# Patient Record
Sex: Female | Born: 1980 | Hispanic: Yes | Marital: Single | Smoking: Never smoker
Health system: Southern US, Community
[De-identification: ages and names within clinical notes are randomized; demographics above are authoritative.]

## PROBLEM LIST (undated history)

## (undated) DIAGNOSIS — Z789 Other specified health status: Secondary | ICD-10-CM

---

## 2015-02-16 NOTE — L&D Delivery Note (Addendum)
Delivery Note Patient's last menstrual period was 04/20/2015 (exact date). Estimated Date of Delivery: 01/25/16  39.4  At 12:33 PM a viable female "Caryn BeeKevin" was delivered via Vaginal Delivery after Cesarean (Presentation: cephalic; LOA).  APGAR: 8, 9; weight 8 lb 5.3 oz (3780 g).   Placenta status: spontaneous, intact.  Cord: 3vessels with the following complications: none apparent.  Cord pH: not collected  Anesthesia:  epidural Episiotomy: None Lacerations: None Suture Repair: none Est. Blood Loss (mL):  300cc  Mom to postpartum.  Baby to Couplet care / Skin to Skin.  [facility]  She was ruptured for clear fluid and progressed to complete.  Short 2nd stage.  Baby delivered without difficulty and placed on mom's chest.  Due to short cord, it was clamped x2 and cut by me (FOB and mom declined).  Placenta delivered spontaneously and intact.  No lacerations.  IV pitocin bolus started.   Chelsea C Ward 01/22/2016, 1:51 PM

## 2015-06-06 ENCOUNTER — Encounter: Payer: Self-pay | Admitting: Emergency Medicine

## 2015-06-06 ENCOUNTER — Emergency Department
Admission: EM | Admit: 2015-06-06 | Discharge: 2015-06-06 | Disposition: A | Discharge status: Home / Self Care | Payer: Self-pay | Attending: Emergency Medicine | Admitting: Emergency Medicine

## 2015-06-06 ENCOUNTER — Emergency Department: Payer: Self-pay

## 2015-06-06 DIAGNOSIS — O209 Hemorrhage in early pregnancy, unspecified: Secondary | ICD-10-CM

## 2015-06-06 DIAGNOSIS — O208 Other hemorrhage in early pregnancy: Secondary | ICD-10-CM | POA: Insufficient documentation

## 2015-06-06 DIAGNOSIS — O43891 Other placental disorders, first trimester: Secondary | ICD-10-CM

## 2015-06-06 DIAGNOSIS — Z3A01 Less than 8 weeks gestation of pregnancy: Secondary | ICD-10-CM | POA: Insufficient documentation

## 2015-06-06 LAB — URINALYSIS COMPLETE WITH MICROSCOPIC (ARMC ONLY)
BILIRUBIN URINE: NEGATIVE
GLUCOSE, UA: NEGATIVE mg/dL
Leukocytes, UA: NEGATIVE
Nitrite: NEGATIVE
Protein, ur: 100 mg/dL — AB
SPECIFIC GRAVITY, URINE: 1.024 (ref 1.005–1.030)
pH: 7 (ref 5.0–8.0)

## 2015-06-06 LAB — COMPREHENSIVE METABOLIC PANEL
ALK PHOS: 62 U/L (ref 38–126)
ALT: 38 U/L (ref 14–54)
ANION GAP: 7 (ref 5–15)
AST: 24 U/L (ref 15–41)
Albumin: 4.1 g/dL (ref 3.5–5.0)
BUN: 14 mg/dL (ref 6–20)
CALCIUM: 8.9 mg/dL (ref 8.9–10.3)
CO2: 24 mmol/L (ref 22–32)
Chloride: 105 mmol/L (ref 101–111)
Creatinine, Ser: 0.86 mg/dL (ref 0.44–1.00)
GFR calc non Af Amer: >60 mL/min (ref >60)
Glucose, Bld: 104 mg/dL — ABNORMAL HIGH (ref 65–99)
Potassium: 3.4 mmol/L — ABNORMAL LOW (ref 3.5–5.1)
SODIUM: 136 mmol/L (ref 135–145)
TOTAL PROTEIN: 7.6 g/dL (ref 6.5–8.1)
Total Bilirubin: 0.4 mg/dL (ref 0.3–1.2)

## 2015-06-06 LAB — CBC
HCT: 38.5 % (ref 35.0–47.0)
HEMOGLOBIN: 13.5 g/dL (ref 12.0–16.0)
MCH: 31.1 pg (ref 26.0–34.0)
MCHC: 35.2 g/dL (ref 32.0–36.0)
MCV: 88.5 fL (ref 80.0–100.0)
Platelets: 279 10*3/uL (ref 150–440)
RBC: 4.35 MIL/uL (ref 3.80–5.20)
RDW: 13.5 % (ref 11.5–14.5)
WBC: 11.2 10*3/uL — ABNORMAL HIGH (ref 3.6–11.0)

## 2015-06-06 LAB — HCG, QUANTITATIVE, PREGNANCY: HCG, BETA CHAIN, QUANT, S: 55891 m[IU]/mL — AB (ref <5)

## 2015-06-06 LAB — ABO/RH: ABO/RH(D): A POS

## 2015-06-06 NOTE — Discharge Instructions (Signed)
Hemorragia vaginal durante el embarazo (primer trimestre) (Vaginal Bleeding During Pregnancy, First Trimester) Durante los primeros meses del embarazo es relativamente frecuente que se presente una pequea hemorragia (manchas). Esta situacin generalmente mejora por s misma. Estas hemorragias o manchas tienen diversas causas al inicio del embarazo. Algunas manchas pueden estar relacionadas al Big Lotsembarazo y otras no. En la International Business Machinesmayora de los casos, la hemorragia es normal y no es un problema. Sin embargo, la hemorragia tambin puede ser un signo de algo grave. Debe informar a su mdico de inmediato si tiene alguna hemorragia vaginal. Algunas causas posibles de hemorragia vaginal durante el primer trimestre incluyen:  Infeccin o inflamacin del cuello del tero.  Crecimientos anormales (plipos) en el cuello del tero.  Aborto espontneo o amenaza de aborto espontneo.  Tejido del Psychiatristembarazo se ha desarrollado fuera del tero y en una trompa de falopio (embarazo ectpico).  Se han desarrollado pequeos quistes en el tero en lugar de tejido de embarazo (embarazo molar). INSTRUCCIONES PARA EL CUIDADO EN EL HOGAR  Controle su afeccin para ver si hay cambios. Las siguientes indicaciones ayudarn a Psychologist, educationalaliviar cualquier Longs Drug Storesmolestia que pueda sentir:  Siga las indicaciones del mdico para restringir su actividad. Si el mdico le indica descanso en la cama, debe quedarse en la cama y levantarse solo para ir al bao. No obstante, el mdico puede permitirle que continu con tareas livianas.  Si es necesario, organcese para que alguien le ayude con las actividades y responsabilidades cotidianas mientras est en cama.  Lleve un registro de la cantidad y la saturacin de las toallas higinicas que Landscape architectutiliza cada da. Anote este dato.  No use tampones.No se haga duchas vaginales.  No tenga relaciones sexuales u orgasmos hasta que el mdico la autorice.  Si elimina tejido por la vagina, gurdelo para mostrrselo al  American Expressmdico.  Lancasterome solo medicamentos de venta libre o recetados, segn las indicaciones del mdico.  No tome aspirina, ya que puede causar hemorragias.  Cumpla con todas las visitas de control, segn le indique su mdico. SOLICITE ATENCIN MDICA SI:  Tiene un sangrado vaginal en cualquier momento del embarazo.  Tiene calambres o dolores de Poundparto.  Tiene fiebre que los medicamentos no Sports coachpueden controlar. SOLICITE ATENCIN MDICA DE INMEDIATO SI:   Siente calambres intensos en la espalda o en el vientre (abdomen).  Elimina cogulos grandes o tejido por la vagina.  La hemorragia aumenta.  Si se siente mareada, dbil o se desmaya.  Tiene escalofros.  Tiene una prdida importante o sale lquido a borbotones por la vagina.  Se desmaya mientras defeca. ASEGRESE DE QUE:  Comprende estas instrucciones.  Controlar su afeccin.  Recibir ayuda de inmediato si no mejora o si empeora.   Esta informacin no tiene Theme park managercomo fin reemplazar el consejo del mdico. Asegrese de hacerle al mdico cualquier pregunta que tenga.   Document Released: 11/11/2004 Document Revised: 02/06/2013 Elsevier Interactive Patient Education 2016 Elsevier Inc.  Hematoma subcorinico (Subchorionic Hematoma) Un hematoma subcorinico es una acumulacin de sangre entre la pared externa de la placenta y la pared interna del la matriz (tero). La placenta es el rgano que conecta el feto a la pared del tero. La placenta realiza la funcin de alimentacin, respiracin (oxgeno al feto) y el trabajo de eliminacin de desechos (excrecin) del feto.  Un hematoma subcorinico es la anormalidad ms frecuente encontrada en una ecografa durante el primer trimestre o principios del segundo trimestre del embarazo. Si ha habido poca o ninguna hemorragia vaginal, generalmente los pequeos hematomas se reducen por  propia cuenta y no afectan al bebé ni al embarazo. La sangre es absorbida gradualmente durante una o dos semanas.  Cuando la hemorragia comienza más tarde en el embarazo o el hematoma es más grande o se produce en una paciente de edad avanzada, el resultado puede no ser tan bueno. Los grandes hematomas pueden agrandarse aún más y aumenta las posibilidades de aborto espontáneo. El hematoma subcoriónico también aumenta el riesgo de desprendimiento precoz de la placenta del útero, muerte fetal y parto prematuro. °INSTRUCCIONES PARA EL CUIDADO EN EL HOGAR  °· Repose en cama si el médico se lo recomienda. Aunque el reposo en cama no evitará la hemorragia o un aborto espontáneo, su médico puede recomendarlo. °· Evite levantar objetos pesados (más de 10 libras [4,5 kg]), hacer ejercicio, tener relaciones sexuales o realizar duchas vaginales según se lo indique el profesional. °· Lleve un registro de la cantidad y grado de remojo (saturación) de las toallas higiénicas que utiliza cada día. Anote esta información. °· No use tampones. °· Cumpla con todas las visitas de control, según le indique su médico. El profesional podrá pedirle que se realice análisis de seguimiento, pruebas de ultrasonido o ambas. °SOLICITE ATENCIÓN MÉDICA DE INMEDIATO SI:  °· Siente calambres intensos en el estómago, en la espalda, en el abdomen o en la pelvis. °· Tiene fiebre. °· Elimina coágulos o tejidos grandes. Guarde los tejidos para que su médico los vea. °· Si la hemorragia aumenta o siente mareos, debilidad o tiene episodios de desmayos. °  °Esta información no tiene como fin reemplazar el consejo del médico. Asegúrese de hacerle al médico cualquier pregunta que tenga. °  °Document Released: 05/20/2008 Document Revised: 11/22/2012 °Elsevier Interactive Patient Education ©2016 Elsevier Inc. ° ° ° °

## 2015-06-06 NOTE — ED Notes (Signed)
MD and RN to bedside to speak with patient. Tele-interpreter used Jed Limerick(Armando # [phone]). Updated on findings and plan of care. Questions fielded by MD via interpreter.

## 2015-06-06 NOTE — ED Provider Notes (Signed)
[facility] Emergency Department Provider Note  ____________________________________________  Time seen: 2:00 AM  I have reviewed the triage vital signs and the nursing notes.   HISTORY  Chief Complaint Vaginal Bleeding     HPI Melissa French is a 35 y.o. female G5 para 4 presents with pelvic pain vaginal bleeding with a positive home pregnancy tests performed yesterday. Patient states that her last menstrual period was 04/20/2015  Past medical history Four successful pregnancies There are no active problems to display for this patient.   Past Surgical History  Procedure Laterality Date  . Cesarean section      No current outpatient prescriptions on file.  Allergies Review of patient's allergies indicates no known allergies.  No family history on file.  Social History Social History  Substance Use Topics  . Smoking status: Never Smoker   . Smokeless tobacco: None  . Alcohol Use: No    Review of Systems  Constitutional: Negative for fever. Eyes: Negative for visual changes. ENT: Negative for sore throat. Cardiovascular: Negative for chest pain. Respiratory: Negative for shortness of breath. Gastrointestinal: Positive for pelvic pain Genitourinary: Negative for dysuria. Positive for vaginal bleeding Musculoskeletal: Negative for back pain. Skin: Negative for rash. Neurological: Negative for headaches, focal weakness or numbness.   10-point ROS otherwise negative.  ____________________________________________   PHYSICAL EXAM:  VITAL SIGNS: ED Triage Vitals  Enc Vitals Group     BP 06/06/15 0058 119/70 mmHg     Pulse Rate 06/06/15 0058 90     Resp 06/06/15 0058 20     Temp 06/06/15 0058 98.1 F (36.7 C)     Temp Source 06/06/15 0058 Oral     SpO2 06/06/15 0058 100 %     Weight 06/06/15 0058 194 lb (87.998 kg)     Height 06/06/15 0058 5' (1.524 m)     Head Cir --      Peak Flow --      Pain Score 06/06/15 0057  3     Pain Loc --      Pain Edu? --      Excl. in GC? --      Constitutional: Alert and oriented. Well appearing and in no distress. Eyes: Conjunctivae are normal. PERRL. Normal extraocular movements. ENT   Head: Normocephalic and atraumatic.   Nose: No congestion/rhinnorhea.   Mouth/Throat: Mucous membranes are moist.   Neck: No stridor. Hematological/Lymphatic/Immunilogical: No cervical lymphadenopathy. Cardiovascular: Normal rate, regular rhythm. Normal and symmetric distal pulses are present in all extremities. No murmurs, rubs, or gallops. Respiratory: Normal respiratory effort without tachypnea nor retractions. Breath sounds are clear and equal bilaterally. No wheezes/rales/rhonchi. Gastrointestinal: Soft and nontender. No distention. There is no CVA tenderness. Genitourinary: deferred Musculoskeletal: Nontender with normal range of motion in all extremities. No joint effusions.  No lower extremity tenderness nor edema. Neurologic:  Normal speech and language. No gross focal neurologic deficits are appreciated. Speech is normal.  Skin:  Skin is warm, dry and intact. No rash noted. Psychiatric: Mood and affect are normal. Speech and behavior are normal. Patient exhibits appropriate insight and judgment.  ____________________________________________    LABS (pertinent positives/negatives)  Labs Reviewed  CBC - Abnormal; Notable for the following:    WBC 11.2 (*)    All other components within normal limits  COMPREHENSIVE METABOLIC PANEL - Abnormal; Notable for the following:    Potassium 3.4 (*)    Glucose, Bld 104 (*)    All other components within normal limits  HCG, QUANTITATIVE, PREGNANCY - Abnormal; Notable for the following:    hCG, Beta Chain, Quant, [place] [id number] (*)    All other components within normal limits  URINALYSIS COMPLETEWITH MICROSCOPIC ([facility] ONLY) - Abnormal; Notable for the following:    Color, Urine YELLOW (*)    APPearance CLOUDY (*)     Ketones, ur TRACE (*)    Hgb urine dipstick 3+ (*)    Protein, ur 100 (*)    Bacteria, UA RARE (*)    Squamous Epithelial / LPF 6-30 (*)    All other components within normal limits  ABO/RH      RADIOLOGY   US OB Comp Less 14 Wks (Final result) Result time: 06/06/15 04:13:41   Final result by Rad Results In Interface (06/06/15 04:13:41)   Narrative:   CLINICAL DATA: Vaginal bleeding for 1 day. Estimated gestational age by LMP is 6 weeks 5 days. Quantitative beta HCG is 55,891.  EXAM: OBSTETRIC <14 WK US AND TRANSVAGINAL OB US  TECHNIQUE: Both transabdominal and transvaginal ultrasound examinations were performed for complete evaluation of the gestation as well as the maternal uterus, adnexal regions, and pelvic cul-de-sac. Transvaginal technique was performed to assess early pregnancy.  COMPARISON: None.  FINDINGS: Intrauterine gestational sac: A single intrauterine gestational sac is visualized.  Yolk sac: Yolk sac is present.  Embryo: Fetal pole is identified.  Cardiac Activity: Fetal cardiac activity is visualized.  Heart Rate: 124 bpm  CRL: 4.4 mm  6 w  1 d         US EDC: 01/29/2016  Subchorionic hemorrhage: Small subchorionic hemorrhage demonstrated superiorly to the gestational sac and measuring about 2.9 x 1.8 x 2.5 cm.  Maternal uterus/adnexae: The uterus is anteverted. No myometrial mass lesions. Both ovaries are identified. Probable corpus luteum cyst on the right. No abnormal adnexal masses. No free pelvic fluid.  IMPRESSION: Single intrauterine pregnancy. Estimated gestational age by crown-rump length is 6 weeks 1 day. Small subchorionic hemorrhage.   Electronically Signed By: Burman NievesWilliam Stevens M.D. On: 06/06/2015 04:13          US Ob Transvaginal (Final result) Result time: 06/06/15 04:13:41   Final result by Rad Results In Interface (06/06/15 04:13:41)   Narrative:   CLINICAL DATA: Vaginal bleeding for 1  day. Estimated gestational age by LMP is 6 weeks 5 days. Quantitative beta HCG is 55,891.  EXAM: OBSTETRIC <14 WK US AND TRANSVAGINAL OB US  TECHNIQUE: Both transabdominal and transvaginal ultrasound examinations were performed for complete evaluation of the gestation as well as the maternal uterus, adnexal regions, and pelvic cul-de-sac. Transvaginal technique was performed to assess early pregnancy.  COMPARISON: None.  FINDINGS: Intrauterine gestational sac: A single intrauterine gestational sac is visualized.  Yolk sac: Yolk sac is present.  Embryo: Fetal pole is identified.  Cardiac Activity: Fetal cardiac activity is visualized.  Heart Rate: 124 bpm  CRL: 4.4 mm  6 w  1 d         US EDC: 01/29/2016  Subchorionic hemorrhage: Small subchorionic hemorrhage demonstrated superiorly to the gestational sac and measuring about 2.9 x 1.8 x 2.5 cm.  Maternal uterus/adnexae: The uterus is anteverted. No myometrial mass lesions. Both ovaries are identified. Probable corpus luteum cyst on the right. No abnormal adnexal masses. No free pelvic fluid.  IMPRESSION: Single intrauterine pregnancy. Estimated gestational age by crown-rump length is 6 weeks 1 day. Small subchorionic hemorrhage.   Electronically Signed By: Burman NievesWilliam Stevens M.D. On: 06/06/2015 04:13  INITIAL IMPRESSION / ASSESSMENT AND PLAN / ED COURSE  Pertinent labs & imaging results that were available during my care of the patient were reviewed by me and considered in my medical decision making (see chart for details).    ____________________________________________   FINAL CLINICAL IMPRESSION(S) / ED DIAGNOSES  Final diagnoses:  Vaginal bleeding in pregnancy, first trimester  Subchorionic hematoma, first trimester      Darci Current, MD 06/06/15 [phone]

## 2015-06-06 NOTE — ED Notes (Signed)
Patient ambulatory to triage with steady gait, without difficulty or distress noted; pt reports positive home pregnancy test yesterday; now with lower abd cramping and vag bleeding; LMP 3/5; G5P4

## 2015-06-06 NOTE — ED Notes (Signed)
Patient returned to ED 7 from ultrasound at this time. MD made aware that patient is back in her room and that her procedure has been completed.

## 2015-06-06 NOTE — ED Notes (Signed)

## 2015-06-06 NOTE — ED Notes (Signed)
Assessment completed with help of tele-interpreter, Maurine Ministerennis [phone]

## 2015-06-09 ENCOUNTER — Encounter: Payer: Self-pay | Admitting: Emergency Medicine

## 2015-06-09 ENCOUNTER — Emergency Department
Admission: EM | Admit: 2015-06-09 | Discharge: 2015-06-09 | Disposition: A | Discharge status: Home / Self Care | Payer: Self-pay | Attending: Emergency Medicine | Admitting: Emergency Medicine

## 2015-06-09 DIAGNOSIS — O208 Other hemorrhage in early pregnancy: Secondary | ICD-10-CM | POA: Insufficient documentation

## 2015-06-09 DIAGNOSIS — Z3A01 Less than 8 weeks gestation of pregnancy: Secondary | ICD-10-CM | POA: Insufficient documentation

## 2015-06-09 DIAGNOSIS — O468X1 Other antepartum hemorrhage, first trimester: Secondary | ICD-10-CM

## 2015-06-09 DIAGNOSIS — O418X10 Other specified disorders of amniotic fluid and membranes, first trimester, not applicable or unspecified: Secondary | ICD-10-CM

## 2015-06-09 LAB — HCG, QUANTITATIVE, PREGNANCY: hCG, Beta Chain, Quant, S: 76224 m[IU]/mL — ABNORMAL HIGH (ref <5)

## 2015-06-09 NOTE — Discharge Instructions (Signed)
Hemorragia vaginal durante el embarazo (primer trimestre)  (Vaginal Bleeding During Pregnancy, First Trimester)  Durante los primeros meses del embarazo es relativamente frecuente que se presente una pequeña hemorragia (manchas). Esta situación generalmente mejora por sí misma. Estas hemorragias o manchas tienen diversas causas al inicio del embarazo. Algunas manchas pueden estar relacionadas al embarazo y otras no. En la mayoría de los casos, la hemorragia es normal y no es un problema. Sin embargo, la hemorragia también puede ser un signo de algo grave. Debe informar a su médico de inmediato si tiene alguna hemorragia vaginal.  Algunas causas posibles de hemorragia vaginal durante el primer trimestre incluyen:  · Infección o inflamación del cuello del útero.  · Crecimientos anormales (pólipos) en el cuello del útero.  · Aborto espontáneo o amenaza de aborto espontáneo.  · Tejido del embarazo se ha desarrollado fuera del útero y en una trompa de falopio (embarazo ectópico).  · Se han desarrollado pequeños quistes en el útero en lugar de tejido de embarazo (embarazo molar).  INSTRUCCIONES PARA EL CUIDADO EN EL HOGAR   Controle su afección para ver si hay cambios. Las siguientes indicaciones ayudarán a aliviar cualquier molestia que pueda sentir:  · Siga las indicaciones del médico para restringir su actividad. Si el médico le indica descanso en la cama, debe quedarse en la cama y levantarse solo para ir al baño. No obstante, el médico puede permitirle que continué con tareas livianas.  · Si es necesario, organícese para que alguien le ayude con las actividades y responsabilidades cotidianas mientras está en cama.  · Lleve un registro de la cantidad y la saturación de las toallas higiénicas que utiliza cada día. Anote este dato.  · No use tampones.No se haga duchas vaginales.  · No tenga relaciones sexuales u orgasmos hasta que el médico la autorice.  · Si elimina tejido por la vagina, guárdelo para mostrárselo al  médico.  · Tome solo medicamentos de venta libre o recetados, según las indicaciones del médico.  · No tome aspirina, ya que puede causar hemorragias.  · Cumpla con todas las visitas de control, según le indique su médico.  SOLICITE ATENCIÓN MÉDICA SI:  · Tiene un sangrado vaginal en cualquier momento del embarazo.  · Tiene calambres o dolores de parto.  · Tiene fiebre que los medicamentos no pueden controlar.  SOLICITE ATENCIÓN MÉDICA DE INMEDIATO SI:   · Siente calambres intensos en la espalda o en el vientre (abdomen).  · Elimina coágulos grandes o tejido por la vagina.  · La hemorragia aumenta.  · Si se siente mareada, débil o se desmaya.  · Tiene escalofríos.  · Tiene una pérdida importante o sale líquido a borbotones por la vagina.  · Se desmaya mientras defeca.  ASEGÚRESE DE QUE:  · Comprende estas instrucciones.  · Controlará su afección.  · Recibirá ayuda de inmediato si no mejora o si empeora.     Esta información no tiene como fin reemplazar el consejo del médico. Asegúrese de hacerle al médico cualquier pregunta que tenga.     Document Released: 11/11/2004 Document Revised: 02/06/2013  Elsevier Interactive Patient Education ©2016 Elsevier Inc.

## 2015-06-09 NOTE — ED Notes (Signed)
Pt presents from home for follow up appointment. States that she was seen here last week and was told to follow up with health department today.  Health department told pt that they cannot see her today and that she would need to come back to ED if she absolutely had to be seen today. Pt understands that she will need to wait to be seen at health department and understands that she may have a long wait today, but agrees that she wants to be seen. (Through interpreter on a stick) pt states she had some bleeding yesterday with cramping, but denies pain and bleeding today.

## 2015-06-09 NOTE — ED Notes (Signed)
See triage note  States she had some small amt of bleeding when wiping  Denies any abd cramping or passing of clots. Dr Alphonzo LemmingsMcShane in with pt on arrival to treatment room

## 2015-06-09 NOTE — ED Notes (Signed)
No bleeding noted since arrival to ED

## 2015-06-09 NOTE — ED Provider Notes (Signed)
[facility] Emergency Department Provider Note  ____________________________________________   I have reviewed the triage vital signs and the nursing notes.   HISTORY  Chief Complaint Follow-up    HPI Melissa French is a 35 y.o. female who is G5 P4 with a known IUP who presents today complaining of not having a good follow-up. She was seen and diagnosed 3 days ago with a subchorionic bleed and a known IUP. She states her bleeding is much better she is only noticed blood on the toilet paper. No dysuria no urinary frequency.She knew she needed to follow up, so she was trying to get into the health department but could not do so and so she came back to the emergency room however she states she has no ongoing complaints and feels much better. This history is taken with the interpreter.     History reviewed. No pertinent past medical history.  There are no active problems to display for this patient.   Past Surgical History  Procedure Laterality Date  . Cesarean section      No current outpatient prescriptions on file.  Allergies Review of patient's allergies indicates no known allergies.  No family history on file.  Social History Social History  Substance Use Topics  . Smoking status: Never Smoker   . Smokeless tobacco: None  . Alcohol Use: No    Review of Systems Constitutional: No fever/chills Eyes: No visual changes. ENT: No sore throat. No stiff neck no neck pain Cardiovascular: Denies chest pain. Respiratory: Denies shortness of breath. Gastrointestinal:   no vomiting.  No diarrhea.  No constipation. Genitourinary: Negative for dysuria. Musculoskeletal: Negative lower extremity swelling Skin: Negative for rash. Neurological: Negative for headaches, focal weakness or numbness. 10-point ROS otherwise negative.  ____________________________________________   PHYSICAL EXAM:  VITAL SIGNS: ED Triage Vitals  Enc Vitals  Group     BP 06/09/15 0947 122/54 mmHg     Pulse Rate 06/09/15 0947 86     Resp 06/09/15 0947 16     Temp 06/09/15 0947 98.5 F (36.9 C)     Temp Source 06/09/15 0947 Oral     SpO2 06/09/15 0947 100 %     Weight 06/09/15 0947 195 lb (88.451 kg)     Height 06/09/15 0947 5\' 2"  (1.575 m)     Head Cir --      Peak Flow --      Pain Score --      Pain Loc --      Pain Edu? --      Excl. in GC? --     Constitutional: Alert and oriented. Well appearing and in no acute distress. Eyes: Conjunctivae are normal. PERRL. EOMI. Head: Atraumatic. Nose: No congestion/rhinnorhea. Mouth/Throat: Mucous membranes are moist.  Oropharynx non-erythematous. Neck: No stridor.   Nontender with no meningismus Cardiovascular: Normal rate, regular rhythm. Grossly normal heart sounds.  Good peripheral circulation. Respiratory: Normal respiratory effort.  No retractions. Lungs CTAB. Abdominal: Soft and nontender. No distention. No guarding no rebound Back:  There is no focal tenderness or step off there is no midline tenderness there are no lesions noted. there is no CVA tenderness Musculoskeletal: No lower extremity tenderness. No joint effusions, no DVT signs strong distal pulses no edema Neurologic:  Normal speech and language. No gross focal neurologic deficits are appreciated.  Skin:  Skin is warm, dry and intact. No rash noted. Psychiatric: Mood and affect are normal. Speech and behavior are normal.  ____________________________________________  LABS (all labs ordered are listed, but only abnormal results are displayed)  Labs Reviewed  HCG, QUANTITATIVE, PREGNANCY - Abnormal; Notable for the following:    hCG, Beta Chain, [place]est [address] (*)    All other components within normal limits   ____________________________________________  EKG  I personally interpreted any EKGs ordered by me or triage  ____________________________________________  RADIOLOGY  I reviewed any imaging ordered by me  or triage that were performed during my shift and, if possible, patient and/or family made aware of any abnormal findings. ____________________________________________   PROCEDURES  Procedure(s) performed: None  Critical Care performed: None  ____________________________________________   INITIAL IMPRESSION / ASSESSMENT AND PLAN / ED COURSE  Pertinent labs & imaging results that were available during my care of the patient were reviewed by me and considered in my medical decision making (see chart for details).  Patient with a known IUP, had a subchorionic hemorrhage and his had trace spotting since her last visit, she has no complaints at this time and she is only here because she could not get easily into an outpatient follow-up situation. I did recheck a Quant which is still trending up. I talked to Dr. Feliberto Gottron, within the patient was to follow-up and he agrees with discharge at this time, return precautions and follow-up given and understood. ____________________________________________   FINAL CLINICAL IMPRESSION(S) / ED DIAGNOSES  Final diagnoses:  None      This chart was dictated using voice recognition software.  Despite best efforts to proofread,  errors can occur which can change meaning.     Jeanmarie Plant, MD 06/09/15 1318

## 2015-06-30 ENCOUNTER — Other Ambulatory Visit: Payer: Self-pay | Admitting: Advanced Practice Midwife

## 2015-06-30 DIAGNOSIS — Z369 Encounter for antenatal screening, unspecified: Secondary | ICD-10-CM

## 2015-07-07 ENCOUNTER — Other Ambulatory Visit (HOSPITAL_COMMUNITY): Payer: Self-pay | Admitting: Family Medicine

## 2015-07-07 ENCOUNTER — Ambulatory Visit
Admission: RE | Admit: 2015-07-07 | Discharge: 2015-07-07 | Disposition: A | Discharge status: Home / Self Care | Payer: Medicaid Other | Source: Ambulatory Visit | Attending: Family Medicine | Admitting: Family Medicine

## 2015-07-07 DIAGNOSIS — R7611 Nonspecific reaction to tuberculin skin test without active tuberculosis: Secondary | ICD-10-CM | POA: Diagnosis present

## 2015-07-21 ENCOUNTER — Ambulatory Visit
Admission: RE | Admit: 2015-07-21 | Discharge: 2015-07-21 | Disposition: A | Discharge status: Home / Self Care | Payer: Self-pay | Source: Ambulatory Visit | Attending: Advanced Practice Midwife | Admitting: Advanced Practice Midwife

## 2015-07-21 ENCOUNTER — Ambulatory Visit (HOSPITAL_BASED_OUTPATIENT_CLINIC_OR_DEPARTMENT_OTHER)
Admission: RE | Admit: 2015-07-21 | Discharge: 2015-07-21 | Disposition: A | Discharge status: Home / Self Care | Payer: Medicaid Other | Source: Ambulatory Visit | Attending: Obstetrics and Gynecology | Admitting: Obstetrics and Gynecology

## 2015-07-21 VITALS — BP 117/60 | HR 70 | Temp 98.1°F | Resp 18 | Ht 61.0 in | Wt 193.0 lb

## 2015-07-21 DIAGNOSIS — Z369 Encounter for antenatal screening, unspecified: Secondary | ICD-10-CM

## 2015-07-21 DIAGNOSIS — O09522 Supervision of elderly multigravida, second trimester: Secondary | ICD-10-CM

## 2015-07-21 DIAGNOSIS — O09529 Supervision of elderly multigravida, unspecified trimester: Secondary | ICD-10-CM | POA: Insufficient documentation

## 2015-07-21 DIAGNOSIS — O99212 Obesity complicating pregnancy, second trimester: Secondary | ICD-10-CM | POA: Insufficient documentation

## 2015-07-21 DIAGNOSIS — O09521 Supervision of elderly multigravida, first trimester: Secondary | ICD-10-CM | POA: Diagnosis not present

## 2015-07-21 DIAGNOSIS — Z36 Encounter for antenatal screening of mother: Secondary | ICD-10-CM | POA: Insufficient documentation

## 2015-07-21 DIAGNOSIS — E669 Obesity, unspecified: Secondary | ICD-10-CM | POA: Insufficient documentation

## 2015-07-21 DIAGNOSIS — Z3A13 13 weeks gestation of pregnancy: Secondary | ICD-10-CM | POA: Insufficient documentation

## 2015-07-21 HISTORY — DX: Other specified health status: Z78.9

## 2015-07-21 NOTE — Progress Notes (Signed)
Referring Provider:   [facility] Department Length of Consultation: 50 minutes  Melissa French was referred to [facility] for genetic counseling because of advanced maternal age.  The patient will be 35 years old at the time of delivery.  This note summarizes the information we discussed.    We explained that the chance of a chromosome abnormality increases with maternal age.  Chromosomes and examples of chromosome problems were reviewed.  Humans typically have 46 chromosomes in each cell, with half passed through each sperm and egg.  Any change in the number or structure of chromosomes can increase the risk of problems in the physical and mental development of a pregnancy.   Based upon age of the patient, the chance of any chromosome abnormality was 1 in 69. The chance of Down syndrome, the most common chromosome problem associated with maternal age, was 1 in 72.  The risk of chromosome problems is in addition to the 3% general population risk for birth defects and mental retardation.  The greatest chance, of course, is that the baby would be born in good health.  We discussed the following prenatal screening and testing options for this pregnancy:  First trimester screening, which includes nuchal translucency ultrasound screen and first trimester maternal serum marker screening.  The nuchal translucency has approximately an 80% detection rate for Down syndrome and can be positive for other chromosome abnormalities as well as heart defects.  When combined with a maternal serum marker screening, the detection rate is up to 90% for Down syndrome and up to 97% for trisomy 18.     The chorionic villus sampling procedure is available for first trimester chromosome analysis.  This involves the withdrawal of a small amount of chorionic villi (tissue from the developing placenta).  Risk of pregnancy loss is estimated to be approximately 1 in 200 to 1 in 100 (0.5 to 1%).   There is approximately a 1% (1 in 100) chance that the CVS chromosome results will be unclear.  Chorionic villi cannot be tested for neural tube defects.     Maternal serum marker screening, a blood test that measures pregnancy proteins, can provide risk assessments for Down syndrome, trisomy 18, and open neural tube defects (spina bifida, anencephaly). Because it does not directly examine the fetus, it cannot positively diagnose or rule out these problems.  Targeted ultrasound uses high frequency sound waves to create an image of the developing fetus.  An ultrasound is often recommended as a routine means of evaluating the pregnancy.  It is also used to screen for fetal anatomy problems (for example, a heart defect) that might be suggestive of a chromosomal or other abnormality.   Amniocentesis involves the removal of a small amount of amniotic fluid from the sac surrounding the fetus with the use of a thin needle inserted through the maternal abdomen and uterus.  Ultrasound guidance is used throughout the procedure.  Fetal cells from amniotic fluid are directly evaluated and > 99.5% of chromosome problems and > 98% of open neural tube defects can be detected. This procedure is generally performed after the 15th week of pregnancy.  The main risks to this procedure include complications leading to miscarriage in less than 1 in 200 cases (0.5%).  We also reviewed the availability of cell free fetal DNA testing from maternal blood to determine whether or not the baby may have either Down syndrome, trisomy 75, or trisomy 60.  This test utilizes a maternal blood sample  and DNA sequencing technology to isolate circulating cell free fetal DNA from maternal plasma.  The fetal DNA can then be analyzed for DNA sequences that are derived from the three most common chromosomes involved in aneuploidy, chromosomes 13, 18, and 21.  If the overall amount of DNA is greater than the expected level for any of these  chromosomes, aneuploidy is suspected.  While we do not consider it a replacement for invasive testing and karyotype analysis, a negative result from this testing would be reassuring, though not a guarantee of a normal chromosome complement for the baby.  An abnormal result is certainly suggestive of an abnormal chromosome complement, though we would still recommend CVS or amniocentesis to confirm any findings from this testing.  We obtained a detailed family history and pregnancy history.  The patient reported that this is her sixth pregnancy, the first with her current partner.  She has four healthy children and a grandson who is also in good health.  She stated that her very first pregnancy resulted in a loss at 6 months.  She described having bleeding prior to delivery and that the doctors told her the baby's face was not properly formed.  She did not see the baby and does not recall a specific diagnosis or cause for the condition.  We reviewed that there may be many reasons for loss of a pregnancy and for birth defects including facial differences.  However, without medical records to indicate a specific diagnosis, it is difficult to determine the cause for the loss or the recurrence chance for future pregnancies.  We did discuss the use of ultrasound to assess fetal anatomy including facial features and the option of AFP testing to test for open neural tube defects because some defects such as anencephaly could result in facial malformations. The remainder of the family history is unremarkable for birth defects, mental retardation, recurrent pregnancy loss or known chromosome abnormalities.  Ms. Junius FinnerHernandez Garcia had some spotting early in this pregnancy, which resolved.  Ultrasound at 6 weeks showed a subchorionic bleed.  She reported no medications, smoking or recreational drug use in the pregnancy.  She did state that she had one alcoholic drink this week.  Alcohol consumption during pregnancy has been  associated with a number of birth defects including growth delays, small head size, heart defects, eye anomalies and facial differences as well as learning disabilities and behavioral problems.  The risk of these to occur tends to increase with the amount of alcohol consumed, however, malformations have been seen with as little as two drinks per day.  Because there is no safe amount of alcohol consumption during pregnancy, we suggest women completely avoid alcohol while pregnant.    After consideration of the options, Ms. Junius FinnerHernandez Garcia elected to proceed with an ultrasound.  She declined first trimester screening and cell free fetal DNA testing due to the costs.  Her presumptive medicaid recently ended and she did not desire to pay out of pocket for either of these screening tests.  She may reconsider after the anatomy ultrasound.  An ultrasound was performed at the time of the visit.  The gestational age was consistent with  13 weeks.  Fetal anatomy could not be assessed due to early gestational age.  Please refer to the ultrasound report for details of that study.  The patient will return in 5-6 weeks for an anatomy ultrasound.  Ms. Junius FinnerHernandez Garcia was encouraged to call with questions or concerns.  We can be contacted at [phone].   Cherly Anderson, MS, CGC

## 2015-08-25 ENCOUNTER — Ambulatory Visit
Admission: RE | Admit: 2015-08-25 | Discharge: 2015-08-25 | Disposition: A | Discharge status: Home / Self Care | Payer: PRIVATE HEALTH INSURANCE | Source: Ambulatory Visit | Attending: Obstetrics and Gynecology | Admitting: Obstetrics and Gynecology

## 2015-08-25 ENCOUNTER — Other Ambulatory Visit: Payer: Self-pay | Admitting: Obstetrics and Gynecology

## 2015-08-25 VITALS — BP 109/55 | HR 69 | Temp 98.4°F | Wt 194.0 lb

## 2015-08-25 DIAGNOSIS — O09522 Supervision of elderly multigravida, second trimester: Secondary | ICD-10-CM

## 2015-08-25 DIAGNOSIS — Z3A18 18 weeks gestation of pregnancy: Secondary | ICD-10-CM | POA: Insufficient documentation

## 2015-09-04 ENCOUNTER — Other Ambulatory Visit: Payer: Self-pay

## 2015-09-04 DIAGNOSIS — O09522 Supervision of elderly multigravida, second trimester: Secondary | ICD-10-CM

## 2015-09-08 ENCOUNTER — Ambulatory Visit
Admission: RE | Admit: 2015-09-08 | Discharge: 2015-09-08 | Disposition: A | Discharge status: Home / Self Care | Payer: PRIVATE HEALTH INSURANCE | Source: Ambulatory Visit | Attending: Obstetrics and Gynecology | Admitting: Obstetrics and Gynecology

## 2015-09-08 DIAGNOSIS — Z3A20 20 weeks gestation of pregnancy: Secondary | ICD-10-CM | POA: Insufficient documentation

## 2015-09-08 DIAGNOSIS — O09522 Supervision of elderly multigravida, second trimester: Secondary | ICD-10-CM | POA: Insufficient documentation

## 2015-12-25 LAB — OB RESULTS CONSOLE GBS: STREP GROUP B AG: NEGATIVE

## 2016-01-22 ENCOUNTER — Inpatient Hospital Stay: Payer: Medicaid Other | Admitting: Anesthesiology

## 2016-01-22 ENCOUNTER — Inpatient Hospital Stay
Admission: EM | Admit: 2016-01-22 | Discharge: 2016-01-23 | DRG: 775 | Disposition: A | Discharge status: Home / Self Care | Payer: Medicaid Other | Attending: Obstetrics & Gynecology | Admitting: Obstetrics & Gynecology

## 2016-01-22 DIAGNOSIS — O26893 Other specified pregnancy related conditions, third trimester: Secondary | ICD-10-CM | POA: Diagnosis present

## 2016-01-22 DIAGNOSIS — Z3493 Encounter for supervision of normal pregnancy, unspecified, third trimester: Secondary | ICD-10-CM | POA: Diagnosis present

## 2016-01-22 DIAGNOSIS — O34211 Maternal care for low transverse scar from previous cesarean delivery: Secondary | ICD-10-CM | POA: Diagnosis present

## 2016-01-22 DIAGNOSIS — Z6790 Unspecified blood type, Rh positive: Secondary | ICD-10-CM | POA: Diagnosis not present

## 2016-01-22 DIAGNOSIS — Z3A39 39 weeks gestation of pregnancy: Secondary | ICD-10-CM | POA: Diagnosis not present

## 2016-01-22 DIAGNOSIS — O0993 Supervision of high risk pregnancy, unspecified, third trimester: Secondary | ICD-10-CM

## 2016-01-22 LAB — TYPE AND SCREEN
ABO/RH(D): A POS
Antibody Screen: NEGATIVE

## 2016-01-22 LAB — CBC
HCT: 42.6 % (ref 35.0–47.0)
Hemoglobin: 15 g/dL (ref 12.0–16.0)
MCH: 31.9 pg (ref 26.0–34.0)
MCHC: 35.1 g/dL (ref 32.0–36.0)
MCV: 90.8 fL (ref 80.0–100.0)
PLATELETS: 253 10*3/uL (ref 150–440)
RBC: 4.69 MIL/uL (ref 3.80–5.20)
RDW: 13.9 % (ref 11.5–14.5)
WBC: 10.8 10*3/uL (ref 3.6–11.0)

## 2016-01-22 MED ORDER — ONDANSETRON HCL 4 MG/2ML IJ SOLN: 4.0000 mg | Freq: Four times a day (QID) | INTRAMUSCULAR | Status: DC | PRN

## 2016-01-22 MED ORDER — LIDOCAINE HCL (PF) 1 % IJ SOLN: 30.0000 mL | INTRAMUSCULAR | Status: DC | PRN

## 2016-01-22 MED ORDER — OXYCODONE-ACETAMINOPHEN 5-325 MG PO TABS: 2.0000 | ORAL_TABLET | ORAL | Status: DC | PRN

## 2016-01-22 MED ORDER — LACTATED RINGERS IV SOLN: 500.0000 mL | INTRAVENOUS | Status: DC | PRN

## 2016-01-22 MED ORDER — DIPHENHYDRAMINE HCL 50 MG/ML IJ SOLN: 12.5000 mg | INTRAMUSCULAR | Status: DC | PRN

## 2016-01-22 MED ORDER — LIDOCAINE HCL (PF) 1 % IJ SOLN
INTRAMUSCULAR | Status: DC | PRN
  Administered 2016-01-22: 3 mL

## 2016-01-22 MED ORDER — NALOXONE HCL 0.4 MG/ML IJ SOLN: 0.4000 mg | INTRAMUSCULAR | Status: DC | PRN

## 2016-01-22 MED ORDER — SODIUM CHLORIDE 0.9% FLUSH: 3.0000 mL | INTRAVENOUS | Status: DC | PRN

## 2016-01-22 MED ORDER — LIDOCAINE HCL (PF) 1 % IJ SOLN
INTRAMUSCULAR | Status: AC
  Filled 2016-01-22: qty 30

## 2016-01-22 MED ORDER — NALBUPHINE HCL 10 MG/ML IJ SOLN: 5.0000 mg | INTRAMUSCULAR | Status: DC | PRN

## 2016-01-22 MED ORDER — OXYTOCIN 40 UNITS IN LACTATED RINGERS INFUSION - SIMPLE MED: 2.5000 [IU]/h | INTRAVENOUS | Status: DC

## 2016-01-22 MED ORDER — LACTATED RINGERS IV BOLUS (SEPSIS)
1000.0000 mL | Freq: Once | INTRAVENOUS | Status: AC
  Administered 2016-01-22: 1000 mL via INTRAVENOUS

## 2016-01-22 MED ORDER — DIPHENHYDRAMINE HCL 25 MG PO CAPS
25.0000 mg | ORAL_CAPSULE | Freq: Four times a day (QID) | ORAL | Status: DC | PRN
Start: 2016-01-22 — End: 2016-01-23

## 2016-01-22 MED ORDER — LIDOCAINE-EPINEPHRINE (PF) 1.5 %-1:200000 IJ SOLN
INTRAMUSCULAR | Status: DC | PRN
  Administered 2016-01-22: 3 mL via PERINEURAL

## 2016-01-22 MED ORDER — OXYTOCIN 40 UNITS IN LACTATED RINGERS INFUSION - SIMPLE MED
INTRAVENOUS | Status: AC
  Administered 2016-01-22: 500 mL via INTRAVENOUS
  Filled 2016-01-22: qty 1000

## 2016-01-22 MED ORDER — DOCUSATE SODIUM 100 MG PO CAPS
100.0000 mg | ORAL_CAPSULE | Freq: Two times a day (BID) | ORAL | Status: DC
  Administered 2016-01-22 – 2016-01-23 (×2): 100 mg via ORAL
  Filled 2016-01-22 (×2): qty 1

## 2016-01-22 MED ORDER — OXYCODONE-ACETAMINOPHEN 5-325 MG PO TABS
1.0000 | ORAL_TABLET | ORAL | Status: DC | PRN
  Administered 2016-01-22: 1 via ORAL
  Filled 2016-01-22: qty 1

## 2016-01-22 MED ORDER — FENTANYL 2.5 MCG/ML W/ROPIVACAINE 0.2% IN NS 100 ML EPIDURAL INFUSION (ARMC-ANES)
EPIDURAL | Status: DC | PRN
  Administered 2016-01-22: 10 mL/h via EPIDURAL

## 2016-01-22 MED ORDER — MISOPROSTOL 200 MCG PO TABS
ORAL_TABLET | ORAL | Status: AC
  Filled 2016-01-22: qty 4

## 2016-01-22 MED ORDER — AMMONIA AROMATIC IN INHA
RESPIRATORY_TRACT | Status: AC
  Filled 2016-01-22: qty 10

## 2016-01-22 MED ORDER — ACETAMINOPHEN 325 MG PO TABS: 650.0000 mg | ORAL_TABLET | ORAL | Status: DC | PRN

## 2016-01-22 MED ORDER — KETOROLAC TROMETHAMINE 30 MG/ML IJ SOLN: 30.0000 mg | Freq: Four times a day (QID) | INTRAMUSCULAR | Status: DC | PRN

## 2016-01-22 MED ORDER — OXYTOCIN 10 UNIT/ML IJ SOLN
INTRAMUSCULAR | Status: AC
  Filled 2016-01-22: qty 2

## 2016-01-22 MED ORDER — BUPIVACAINE HCL (PF) 0.25 % IJ SOLN
INTRAMUSCULAR | Status: DC | PRN
  Administered 2016-01-22: 4 mL via EPIDURAL
  Administered 2016-01-22: 5 mL via EPIDURAL

## 2016-01-22 MED ORDER — FENTANYL 2.5 MCG/ML W/ROPIVACAINE 0.2% IN NS 100 ML EPIDURAL INFUSION (ARMC-ANES)
EPIDURAL | Status: AC
  Filled 2016-01-22: qty 100

## 2016-01-22 MED ORDER — WITCH HAZEL-GLYCERIN EX PADS: 1.0000 "application " | MEDICATED_PAD | CUTANEOUS | Status: DC | PRN

## 2016-01-22 MED ORDER — OXYTOCIN BOLUS FROM INFUSION: 500.0000 mL | Freq: Once | INTRAVENOUS | Status: DC

## 2016-01-22 MED ORDER — LACTATED RINGERS IV SOLN: INTRAVENOUS | Status: DC

## 2016-01-22 MED ORDER — DIBUCAINE 1 % RE OINT: 1.0000 "application " | TOPICAL_OINTMENT | RECTAL | Status: DC | PRN

## 2016-01-22 MED ORDER — ACETAMINOPHEN 500 MG PO TABS: 1000.0000 mg | ORAL_TABLET | Freq: Four times a day (QID) | ORAL | Status: DC | PRN

## 2016-01-22 MED ORDER — BUTORPHANOL TARTRATE 1 MG/ML IJ SOLN: 1.0000 mg | INTRAMUSCULAR | Status: DC | PRN

## 2016-01-22 MED ORDER — VARICELLA VIRUS VACCINE LIVE 1350 PFU/0.5ML IJ SUSR: 0.5000 mL | INTRAMUSCULAR | Status: DC | PRN

## 2016-01-22 MED ORDER — OXYTOCIN BOLUS FROM INFUSION
500.0000 mL | Freq: Once | INTRAVENOUS | Status: AC
  Administered 2016-01-22: 500 mL via INTRAVENOUS

## 2016-01-22 MED ORDER — SOD CITRATE-CITRIC ACID 500-334 MG/5ML PO SOLN: 30.0000 mL | ORAL | Status: DC | PRN

## 2016-01-22 MED ORDER — PRENATAL MULTIVITAMIN CH
1.0000 | ORAL_TABLET | Freq: Every day | ORAL | Status: DC
  Administered 2016-01-23: 1 via ORAL
  Filled 2016-01-22: qty 1

## 2016-01-22 MED ORDER — DEXTROSE 5 % IV SOLN
1.0000 ug/kg/h | INTRAVENOUS | Status: DC | PRN
  Filled 2016-01-22: qty 2

## 2016-01-22 MED ORDER — NALBUPHINE HCL 10 MG/ML IJ SOLN: 5.0000 mg | Freq: Once | INTRAMUSCULAR | Status: DC | PRN

## 2016-01-22 MED ORDER — TETANUS-DIPHTH-ACELL PERTUSSIS 5-2.5-18.5 LF-MCG/0.5 IM SUSP: 0.5000 mL | Freq: Once | INTRAMUSCULAR | Status: DC

## 2016-01-22 MED ORDER — ONDANSETRON HCL 4 MG/2ML IJ SOLN: 4.0000 mg | INTRAMUSCULAR | Status: DC | PRN

## 2016-01-22 MED ORDER — DIPHENHYDRAMINE HCL 25 MG PO CAPS: 25.0000 mg | ORAL_CAPSULE | ORAL | Status: DC | PRN

## 2016-01-22 MED ORDER — ONDANSETRON HCL 4 MG/2ML IJ SOLN: 4.0000 mg | Freq: Three times a day (TID) | INTRAMUSCULAR | Status: DC | PRN

## 2016-01-22 MED ORDER — OXYTOCIN 40 UNITS IN LACTATED RINGERS INFUSION - SIMPLE MED
INTRAVENOUS | Status: AC
  Administered 2016-01-22: 1 m[IU]/min
  Filled 2016-01-22: qty 1000

## 2016-01-22 MED ORDER — LACTATED RINGERS IV SOLN
INTRAVENOUS | Status: DC
  Administered 2016-01-22: 07:00:00 via INTRAVENOUS

## 2016-01-22 MED ORDER — OXYCODONE-ACETAMINOPHEN 5-325 MG PO TABS: 1.0000 | ORAL_TABLET | ORAL | Status: DC | PRN

## 2016-01-22 MED ORDER — ONDANSETRON HCL 4 MG PO TABS: 4.0000 mg | ORAL_TABLET | ORAL | Status: DC | PRN

## 2016-01-22 MED ORDER — MEPERIDINE HCL 25 MG/ML IJ SOLN: 6.2500 mg | INTRAMUSCULAR | Status: DC | PRN

## 2016-01-22 MED ORDER — SIMETHICONE 80 MG PO CHEW
80.0000 mg | CHEWABLE_TABLET | ORAL | Status: DC | PRN
Start: 2016-01-22 — End: 2016-01-23

## 2016-01-22 MED ORDER — FENTANYL 2.5 MCG/ML W/ROPIVACAINE 0.2% IN NS 100 ML EPIDURAL INFUSION (ARMC-ANES): 10.0000 mL/h | EPIDURAL | Status: DC

## 2016-01-22 MED ORDER — IBUPROFEN 600 MG PO TABS
600.0000 mg | ORAL_TABLET | Freq: Four times a day (QID) | ORAL | Status: DC
  Administered 2016-01-22 – 2016-01-23 (×4): 600 mg via ORAL
  Filled 2016-01-22 (×4): qty 1

## 2016-01-22 MED ORDER — COCONUT OIL OIL: 1.0000 "application " | TOPICAL_OIL | Status: DC | PRN

## 2016-01-22 NOTE — Progress Notes (Signed)
Interpreter called to the BS to discuss POC, Maritza.  [facility] Start pit[facility].

## 2016-01-22 NOTE — Progress Notes (Signed)
Epidural catheter removed with blue tip intact. [facility] BR for post delivery void. Pt. With complaint of left leg weakness.  Pt. voided small amount, pericare given, clean peripad and underwear in place.  Pain 0/10.

## 2016-01-22 NOTE — Progress Notes (Signed)
Melissa French is a 35 y.o. [phone] at 3367w4d by LMP admitted for active labor  Subjective: Pt comfortable with epidural  Objective: BP 104/72 (BP Location: Left Arm)   Pulse 93   Temp 98.2 F (36.8 C) (Oral)   Resp 16   Ht (P) 5\' 1"  (1.549 m)   Wt 203 lb (92.1 kg)   LMP 04/20/2015 (Exact Date)   BMI (P) 38.36 kg/m  No intake/output data recorded. No intake/output data recorded.  FHT:  FHR: 140 bpm, variability: moderate,  accelerations:  Present,  decelerations:  Absent UC:   regular, every 7 minutes SVE:   Dilation: 9 Effacement (%): 100 Station: -2 Exam by:: Millner, RN  Labs: Lab Results  Component Value Date   WBC 10.8 01/22/2016   HGB 15.0 01/22/2016   HCT 42.6 01/22/2016   MCV 90.8 01/22/2016   PLT 253 01/22/2016    Assessment / Plan: Spontaneous labor, progressing normally  Labor: AROM now for clear fluid with Cat I strip. When ruptured, the cervix is not held open by the bulging bag and is approx 8cm, very stretchy, with 70% effaced. -2 station. Fetal Wellbeing:  Category I Pain Control:  Epidural Anticipated MOD:  NSVD  Melissa French 01/22/2016, 8:17 AM

## 2016-01-22 NOTE — Discharge Instructions (Signed)

## 2016-01-22 NOTE — Anesthesia Preprocedure Evaluation (Signed)
Anesthesia Evaluation  Patient identified by MRN, date of birth, ID band Patient awake    Reviewed: Allergy & Precautions, H&P , NPO status   Airway Mallampati: II   Neck ROM: full    Dental no notable dental hx.    Pulmonary neg pulmonary ROS,    Pulmonary exam normal        Cardiovascular Exercise Tolerance: Good negative cardio ROS Normal cardiovascular exam     Neuro/Psych negative neurological ROS  negative psych ROS   GI/Hepatic negative GI ROS, Neg liver ROS,   Endo/Other  Well Controlled  Renal/GU negative Renal ROS  negative genitourinary   Musculoskeletal   Abdominal   Peds  Hematology negative hematology ROS (+)   Anesthesia Other Findings   Reproductive/Obstetrics (+) Pregnancy                             Anesthesia Physical Anesthesia Plan  ASA: II  Anesthesia Plan: Epidural   Post-op Pain Management:    Induction:   Airway Management Planned:   Additional Equipment:   Intra-op Plan:   Post-operative Plan:   Informed Consent:   Plan Discussed with: Anesthesiologist  Anesthesia Plan Comments:         Anesthesia Quick Evaluation

## 2016-01-22 NOTE — Progress Notes (Signed)
Video Remote Interpreter used to consent patient and remains [facility] while [facility] is being placed.Marland Kitchen. Sherilyn CooterHenry [phone].

## 2016-01-22 NOTE — Progress Notes (Signed)
Intrapartum note:  S: patient comfortable with epidural  O: BP 104/72 (BP Location: Left Arm)   Pulse 93   Temp 98.2 F (36.8 C) (Oral)   Resp 16   Ht 5\' 1"  (1.549 m)   Wt 92.1 kg (203 lb)   LMP 04/20/2015 (Exact Date)   BMI 38.36 kg/m   FHT: 140 moderate no accels early decels TOCO: q2-485min pitocin at 5 SVE: 7/80/-1   A/P: 35yo [id number]@G6P4014@ 39.4 with labor, TOLAC  1. Continue active management with labor augmentation: pitocin 2. IUP: category 1 tracing  3. VBAC protocol still in place  ----- Ranae Plumberhelsea Ward, MD Attending Obstetrician and Gynecologist [facility], Department of OB[facility]GYN [facility]

## 2016-01-22 NOTE — Anesthesia Procedure Notes (Signed)
Epidural Patient location during procedure: OB Start time: 01/22/2016 7:30 AM End time: 01/22/2016 7:56 AM  Staffing Anesthesiologist: Lenard SimmerKARENZ, ANDREW Resident/CRNA: Ginger CarneMICHELET, Ashwin Tibbs Performed: resident/CRNA   Preanesthetic Checklist Completed: patient identified, site marked, surgical consent, pre-op evaluation, timeout performed, IV checked, risks and benefits discussed and monitors and equipment checked  Epidural Patient position: sitting Prep: Betadine Patient monitoring: heart rate, continuous pulse ox and blood pressure Approach: midline Location: L3-L4 Injection technique: LOR air  Needle:  Needle type: Tuohy  Needle gauge: 17 G Needle length: 9 cm and 9 Needle insertion depth: 8 cm Catheter type: closed end flexible Catheter size: 19 Gauge Catheter at skin depth: 13 cm Test dose: negative and 1.5% lidocaine with Epi 1:200 K  Assessment Sensory level: T10 Events: blood not aspirated, injection not painful, no injection resistance, negative IV test and no paresthesia  Additional Notes Pt. Evaluated and documentation done after procedure finished. Patient identified. Risks/Benefits/Options discussed with patient including but not limited to bleeding, infection, nerve damage, paralysis, failed block, incomplete pain control, headache, blood pressure changes, nausea, vomiting, reactions to medication both or allergic, itching and postpartum back pain. Confirmed with bedside nurse the patient's most recent platelet count. Confirmed with patient that they are not currently taking any anticoagulation, have any bleeding history or any family history of bleeding disorders. Patient expressed understanding and wished to proceed. All questions were answered. Sterile technique was used throughout the entire procedure. Please see nursing notes for vital signs. Test dose was given through epidural catheter and negative prior to continuing to dose epidural or start infusion. Warning signs  of high block given to the patient including shortness of breath, tingling/numbness in hands, complete motor block, or any concerning symptoms with instructions to call for help. Patient was given instructions on fall risk and not to get out of bed. All questions and concerns addressed with instructions to call with any issues or inadequate analgesia.   Patient tolerated the insertion well without immediate complications.Reason for block:procedure for pain

## 2016-01-22 NOTE — Discharge Summary (Signed)
Obstetrical Discharge Summary  Patient Name: Melissa SchleinMaria L Hernandez French DOB: March 20, 1980 MRN: [id number]  Date of Admission: 01/22/2016 Date of Discharge: 01/22/2016  Primary OB: [facility]   Gestational Age at Delivery: 5566w4d   Antepartum complications: history of cesarean, history of GDM, positive PPD, varicella non-immue, abnormal glucose tolerance test, obesity Admitting Diagnosis: labor, TOLAC Secondary Diagnosis: Patient Active Problem List   Diagnosis Date Noted  . Supervision of high risk pregnancy in third trimester 01/22/2016  . Advanced maternal age in multigravida 07/21/2015    Augmentation: Pitocin Complications: None Intrapartum complications/course: [facility] presented in labor [facility]  She was ruptured for clear fluid and progressed to complete.  Short 2nd stage.  Baby delivered without difficulty and placed on mom's chest.  Due to short cord, it was clamped x2 and cut by me (FOB and mom declined).  Placenta delivered spontaneously and intact.  No lacerations.  IV pitocin bolus started Date of Delivery: 01/22/16 Delivered By: Leeroy Bockhelsea Ward Delivery Type: vaginal birth after cesarean (VBAC) Anesthesia: epidural Placenta: sponatneous Laceration: none Episiotomy: none Newborn Data: KEVIN Live born female  Birth Weight: 8 lb 5.3 oz (3780 g) APGAR: 8, 9    Postpartum Procedures: None  Post partum course: Stable  Patient had an uncomplicated postpartum course.  By time of discharge on PPD#1, her pain was controlled on oral pain medications; she had appropriate lochia and was ambulating, voiding without difficulty and tolerating regular diet.  She was deemed stable for discharge to home.     Discharge Physical Exam: Stable  BP 102/68 (BP Location: Left Arm)   Pulse 82   Temp 98.5 F (36.9 C) (Oral)   Resp 17   Ht 5\' 1"  (1.549 m)   Wt 92.1 kg (203 lb)   LMP 04/20/2015 (Exact Date)   Breastfeeding? Unknown   BMI 38.36 kg/m   General: NAD CV: RRR Pulm: CTABL,  nl effort ABD: s/nd/nt, fundus firm and below the umbilicus Lochia: moderate DVT Evaluation: LE non-ttp, no evidence of DVT on exam.  Hemoglobin  Date Value Ref Range Status  01/22/2016 15.0 12.0 - 16.0 g/dL Final   HCT  Date Value Ref Range Status  01/22/2016 42.6 35.0 - 47.0 % Final     Disposition: stable, discharge to home. Baby Feeding: breastmilk Baby Disposition: home with mom  Rh Immune globulin given: n/a Rubella vaccine given: n/a Tdap vaccine given in AP or PP setting: AP Flu vaccine given in AP or PP setting: AP  Contraception: OCPs vs LARC  Prenatal Labs:  ABO, Rh: A POS (04/21 0101) Antibody:  neg Rubella:  immune RPR:   non reactive HBsAg:   neg HIV:   neg GBS: Negative (11/09 0000)  Positive PPD during pregnancy  Tdap: given 9/25 Varicella: NONimmune [ ]  Varivax pp Influenza: given 06/30/15, 12/15/15   Plan:  Melissa SchleinMaria L Hernandez French was discharged to home in good condition. Follow-up appointment at [facility] in 2 weeks for treatment for latent tuberculosis    Discharge Medications: PNV and Ibuprofen    Signed: Sharee Pimplearon W. Dmarion Perfect, MSN, CNM, FNP

## 2016-01-22 NOTE — H&P (Signed)
Melissa SchleinMaria L French French is a 35 y.o. female [id number] at 39+4wks by LMP consistent with early ultrasound presenting for contractions.  Pregnancy c/b: 1. Prior C/[place]1 ten [place]. +gDM during that pregnancy, no diabetes during this pregnany  -- Pt originally wanted TOLAC but states that someone at a prenatal visit told her her baby would get stuck and die. However, the [facility] at [facility] confirm planned TOLAC with planned IOL at 40wks. 2. Positive PPD during pregnancy: +PPD (13mm) short course of rifampin with transaminitis and rash- CXR 2017=no active disease--> needs postpartum treatment for LTBI at [facility] per RN Broadus JohnWarren- 3. Varicella non-immune: Needs vaccination postpartum 4. Failed screening glucola, passed 3 hr dx test. 5. Declined aneuploidy screening due to cost. Normal 13w and 18w anatomy scans. 6. Obesity  OB History    Gravida Para Term Preterm AB Living   6 5 4 1   4    SAB TAB Ectopic Multiple Live Births           5     Past Medical History:  Diagnosis Date  . Medical history non-contributory    Past Surgical History:  Procedure Laterality Date  . CESAREAN SECTION     Family History: family history is not on file. Social History:  reports that she has never smoked. She has never used smokeless tobacco. She reports that she does not drink alcohol or use drugs.     Maternal Diabetes: No Genetic Screening: Declined Maternal Ultrasounds/Referrals: Normal Fetal Ultrasounds or other Referrals:  None Maternal Substance Abuse:  No Significant Maternal Medications:  None Significant Maternal Lab Results:  Lab values include: Other:  positive PPD Other Comments:  None  ROS History Dilation: 3.5 Effacement (%): 90 Station: -1, -2 Exam by:: RS Blood pressure 122/77, pulse 88, temperature 98 F (36.7 C), temperature source Oral, resp. rate 18, height 5\' 1"  (1.549 m), weight 203 lb (92.1 kg), last menstrual period 04/20/2015. Exam Physical  Exam  Prenatal labs: ABO, Rh: --/--/A POS (04/21 0101) Antibody:  neg Rubella:  immune RPR:   non reactive HBsAg:   neg HIV:   neg GBS: Negative (11/09 0000)  Positive PPD during pregnancy  Tdap: given 9/25 Varicella: NONimmune [ ]  Varivax pp Influenza: given 06/30/15, 12/15/15  Assessment/Plan: 1. Fetal Well being  - Fetal Tracing: Cat I strip - Group B Streptococcus ppx indicated: no - Presentation: vtx confirmed by sutures   2. Routine OB: - Prenatal labs reviewed, as above - Rh positive  3. Monitoring of Labor:  - Pt decided during my initial discussion with her to continue to rLTCS because of a fear that "the baby will die" because she can't effectively push it out. This was said with the medical interpreter. We made that decision and were moving forward with it; however, her cervix was rechecked prior to starting her iv and she was found to be 9 cm with a bulging bag. I discussed with her the advisability of proceeding with TOLAC as she is well advanced in labor. The change from 3-9 cm took place in <2hrs. However, this decision is fully her choice. I am not sure what counseling she received and there is always a chance of shoulder dytocia with any delivery. Leopolds for this baby are approx 8 lbs and she has proven vaginal delivery 3x. It is reasonable to try for vaginal delivery. We will have high suspicion for hemorrhage and shoulder dystocia, as always. - TOLAC protocol initiated -  Contractions external toco in place -  Pelvis proven to unknown- prior deliveries [place]wife. Last baby 10 yrs ago macrosomic with gestational diabetes at 10# -  Plan for continuous fetal monitoring  -  Maternal pain control with iv or regional as desired - Anticipate vaginal delivery  4. Post Partum Planning: - Infant feeding: breast - Contraception: OCPs vs LARC    Reeve Mallo 01/22/2016, 5:58 AM

## 2016-01-23 LAB — CBC
HEMATOCRIT: 37.9 % (ref 35.0–47.0)
Hemoglobin: 13.3 g/dL (ref 12.0–16.0)
MCH: 32.1 pg (ref 26.0–34.0)
MCHC: 34.9 g/dL (ref 32.0–36.0)
MCV: 92 fL (ref 80.0–100.0)
PLATELETS: 212 10*3/uL (ref 150–440)
RBC: 4.12 MIL/uL (ref 3.80–5.20)
RDW: 14 % (ref 11.5–14.5)
WBC: 13.1 10*3/uL — ABNORMAL HIGH (ref 3.6–11.0)

## 2016-01-23 LAB — RPR: RPR Ser Ql: NONREACTIVE

## 2016-01-23 MED ORDER — VARICELLA VIRUS VACCINE LIVE 1350 PFU/0.5ML IJ SUSR
0.5000 mL | Freq: Once | INTRAMUSCULAR | Status: AC
  Administered 2016-01-23: 0.5 mL via SUBCUTANEOUS
  Filled 2016-01-23: qty 0.5

## 2016-01-23 NOTE — Progress Notes (Signed)
Went into room with Rohm and HaasEli RN (spanish speaking) and discussed with pt about how much baby has been spitting up.  Motherstated that the baby has only had a little bit of milk spit up. Nothing like what baby spit up yesterday. Mother has no questions and is ok taking care of baby.  All other care noted to be appropriate with baby. Spoke with Bessemer BendMonica from social work.  She feels like d/c the consult is appropriate.

## 2016-01-23 NOTE — Anesthesia Postprocedure Evaluation (Signed)
Anesthesia Post Note  Patient: Melissa French  Procedure(s) Performed: * No procedures listed *  Patient location during evaluation: Mother Baby Anesthesia Type: Epidural Level of consciousness: awake and alert and oriented Pain management: satisfactory to patient Vital Signs Assessment: post-procedure vital signs reviewed and stable Respiratory status: respiratory function stable Cardiovascular status: stable Postop Assessment: no headache, no backache, epidural receding, no signs of nausea or vomiting, patient able to bend at knees and adequate PO intake Anesthetic complications: no    Last Vitals:  Vitals:   01/22/16 2354 01/23/16 0328  BP: (!) 100/56 (!) 105/53  Pulse: 75 72  Resp: 19 19  Temp: 36.8 C 36.8 C    Last Pain:  Vitals:   01/23/16 0551  TempSrc:   PainSc: 3                  Clydene PughBeane, Paizley Ramella D

## 2016-01-23 NOTE — Progress Notes (Signed)
Dc instructions given. Verbalizes understanding. To be dc home with baby after birth certificate complete

## 2016-01-23 NOTE — Progress Notes (Signed)
Post Partum Day1 Subjective: Wants to go home via interpreter  Objective: Blood pressure (!) 97/54, pulse 70, temperature 98 F (36.7 C), temperature source Oral, resp. rate 18, height 5\' 1"  (1.549 m), weight 92.1 kg (203 lb), last menstrual period 04/20/2015, SpO2 100 %, unknown if currently breastfeeding.  Physical Exam:  General: alert and cooperative  Heart: S1S2, RRR, No M/R/G. Lungs: CTA bilat, no /W/R/R Lochia: mod, no clots Uterine Fundus: firm Incision: Perineum intact  DVT Evaluation:Neg Homans   Recent Labs  01/22/16 0551 01/23/16 0539  HGB 15.0 13.3  HCT 42.6 37.9    Assessment/Plan: A: PPD#1 stable pp VBAC P: Pt wants to go home   LOS: 1 day   Sharee PimpleCaron W Anubis Fundora 01/23/2016, 8:37 AM

## 2016-09-26 ENCOUNTER — Emergency Department
Admission: EM | Admit: 2016-09-26 | Discharge: 2016-09-26 | Disposition: A | Discharge status: Home / Self Care | Payer: Medicaid Other | Attending: Emergency Medicine | Admitting: Emergency Medicine

## 2016-09-26 ENCOUNTER — Encounter: Payer: Self-pay | Admitting: Emergency Medicine

## 2016-09-26 ENCOUNTER — Emergency Department: Payer: Medicaid Other

## 2016-09-26 DIAGNOSIS — Y9389 Activity, other specified: Secondary | ICD-10-CM | POA: Insufficient documentation

## 2016-09-26 DIAGNOSIS — Y998 Other external cause status: Secondary | ICD-10-CM | POA: Insufficient documentation

## 2016-09-26 DIAGNOSIS — L989 Disorder of the skin and subcutaneous tissue, unspecified: Secondary | ICD-10-CM

## 2016-09-26 DIAGNOSIS — X58XXXA Exposure to other specified factors, initial encounter: Secondary | ICD-10-CM | POA: Insufficient documentation

## 2016-09-26 DIAGNOSIS — Y929 Unspecified place or not applicable: Secondary | ICD-10-CM | POA: Insufficient documentation

## 2016-09-26 LAB — CBC WITH DIFFERENTIAL/PLATELET
BASOS PCT: 0 %
Basophils Absolute: 0 10*3/uL (ref 0–0.1)
EOS PCT: 2 %
Eosinophils Absolute: 0.2 10*3/uL (ref 0–0.7)
HEMATOCRIT: 39.7 % (ref 35.0–47.0)
Hemoglobin: 13.8 g/dL (ref 12.0–16.0)
LYMPHS PCT: 34 %
Lymphs Abs: 3.7 10*3/uL — ABNORMAL HIGH (ref 1.0–3.6)
MCH: 31.1 pg (ref 26.0–34.0)
MCHC: 34.8 g/dL (ref 32.0–36.0)
MCV: 89.4 fL (ref 80.0–100.0)
MONO ABS: 0.6 10*3/uL (ref 0.2–0.9)
Monocytes Relative: 5 %
NEUTROS ABS: 6.6 10*3/uL — AB (ref 1.4–6.5)
Neutrophils Relative %: 59 %
PLATELETS: 316 10*3/uL (ref 150–440)
RBC: 4.44 MIL/uL (ref 3.80–5.20)
RDW: 13.3 % (ref 11.5–14.5)
WBC: 11.1 10*3/uL — AB (ref 3.6–11.0)

## 2016-09-26 LAB — LACTIC ACID, PLASMA: Lactic Acid, Venous: 1.3 mmol/L (ref 0.5–1.9)

## 2016-09-26 LAB — BASIC METABOLIC PANEL
ANION GAP: 9 (ref 5–15)
BUN: 19 mg/dL (ref 6–20)
CALCIUM: 8.9 mg/dL (ref 8.9–10.3)
CO2: 23 mmol/L (ref 22–32)
Chloride: 105 mmol/L (ref 101–111)
Creatinine, Ser: 0.89 mg/dL (ref 0.44–1.00)
GFR calc Af Amer: >60 mL/min (ref >60)
GLUCOSE: 118 mg/dL — AB (ref 65–99)
POTASSIUM: 3.5 mmol/L (ref 3.5–5.1)
Sodium: 137 mmol/L (ref 135–145)

## 2016-09-26 MED ORDER — ONDANSETRON HCL 4 MG/2ML IJ SOLN
4.0000 mg | Freq: Once | INTRAMUSCULAR | Status: AC
  Administered 2016-09-26: 4 mg via INTRAVENOUS

## 2016-09-26 MED ORDER — BACITRACIN ZINC 500 UNIT/GM EX OINT
TOPICAL_OINTMENT | CUTANEOUS | Status: AC
  Administered 2016-09-26: 1 via TOPICAL
  Filled 2016-09-26: qty 1.8

## 2016-09-26 MED ORDER — SODIUM CHLORIDE 0.9 % IV BOLUS (SEPSIS)
1000.0000 mL | Freq: Once | INTRAVENOUS | Status: AC
  Administered 2016-09-26: 1000 mL via INTRAVENOUS

## 2016-09-26 MED ORDER — BUPIVACAINE-EPINEPHRINE (PF) 0.5% -1:200000 IJ SOLN
10.0000 mL | Freq: Once | INTRAMUSCULAR | Status: AC
  Administered 2016-09-26: 10 mL
  Filled 2016-09-26 (×2): qty 10

## 2016-09-26 MED ORDER — TRANEXAMIC ACID 1000 MG/10ML IV SOLN
500.0000 mg | Freq: Once | INTRAVENOUS | Status: AC
  Administered 2016-09-26: 500 mg via TOPICAL
  Filled 2016-09-26: qty 10

## 2016-09-26 MED ORDER — MORPHINE SULFATE (PF) 2 MG/ML IV SOLN
2.0000 mg | Freq: Once | INTRAVENOUS | Status: AC
  Administered 2016-09-26: 2 mg via INTRAVENOUS

## 2016-09-26 MED ORDER — BACITRACIN ZINC 500 UNIT/GM EX OINT
TOPICAL_OINTMENT | Freq: Once | CUTANEOUS | Status: AC
  Administered 2016-09-26: 1 via TOPICAL

## 2016-09-26 MED ORDER — HYDROCODONE-ACETAMINOPHEN 5-325 MG PO TABS
2.0000 | ORAL_TABLET | Freq: Once | ORAL | Status: AC
  Administered 2016-09-26: 2 via ORAL
  Filled 2016-09-26: qty 2

## 2016-09-26 MED ORDER — CEFAZOLIN SODIUM-DEXTROSE 1-4 GM/50ML-% IV SOLN
1.0000 g | Freq: Once | INTRAVENOUS | Status: AC
  Administered 2016-09-26: 1 g via INTRAVENOUS

## 2016-09-26 MED ORDER — HYDROCODONE-ACETAMINOPHEN 5-325 MG PO TABS: 1.0000 | ORAL_TABLET | ORAL | 0 refills | Status: DC | PRN

## 2016-09-26 NOTE — ED Provider Notes (Signed)
-----------------------------------------   10:32 AM on 09/26/2016 -----------------------------------------  Dr. Alberteen Spindleline has seen the patient and excised the lesion. Patient doing well. We will discharge with crutches with weightbearing as tolerated for comfort. We'll discharge with pain medication the patient will follow-up with podiatry on Tuesday. She is agreeable to this plan.   Minna AntisPaduchowski, Roseann Kees, MD 09/26/16 1032

## 2016-09-26 NOTE — ED Notes (Signed)
XR at bedside

## 2016-09-26 NOTE — ED Notes (Signed)
Patient's discharge and follow up information reviewed with patient by ED nursing staff and patient given the opportunity to ask questions pertaining to ED visit and discharge plan of care. Patient advised that should symptoms not continue to improve, resolve entirely, or should new symptoms develop then a follow up visit with their PCP or a return visit to the ED may be warranted. Patient verbalized consent and understanding of discharge plan of care including potential need for further evaluation. Patient discharged in stable condition per attending ED physician on duty.   Discharge instructions reviewed with patient via medical interpreter Isaias.

## 2016-09-26 NOTE — ED Triage Notes (Signed)
Patient was being tx at her MD with Bactrim for possible abscess, patient woke up tonight in severe pain and with foot bleeding.  EMS brought patient in with abdominal pad soaked wrapped in roll gauze.  Patient AO complaining of 7/10 pain.  Family at bedside.

## 2016-09-26 NOTE — ED Provider Notes (Signed)
[facility] Emergency Department Provider Note   ____________________________________________   First MD Initiated Contact with Patient 09/26/16 [phone]     (approximate)  I have reviewed the triage vital signs and the nursing notes.   HISTORY  Chief Complaint Wound  Husband interprets for patient  HPI Melissa French is a 36 y.o. female brought to the ED from home via EMS with a chief complaint of foot wound. Patient reports "knot" to the top of her right foot for the past 2 months. Seen [facility] 4 days ago and started on Bactrim for presumed abscess. Awoke tonight in severe pain and found the foot to be bleeding from the wound. Denies original trauma or injury. Thought initially her shoe rubbed against the top of her foot causing her wound. Denies recent fever, chills, chest pain, shortness of breath, abdominal pain, nausea, vomiting.Denies use of anticoagulants. Denies recent travel or trauma. Nothing makes her symptoms better or worse.   Past Medical History:  Diagnosis Date  . Medical history non-contributory     Patient Active Problem List   Diagnosis Date Noted  . Supervision of high risk pregnancy in third trimester 01/22/2016  . Advanced maternal age in multigravida 07/21/2015    Past Surgical History:  Procedure Laterality Date  . CESAREAN SECTION      Prior to Admission medications   Medication Sig Start Date End Date Taking? Authorizing Provider  Prenatal Vit-Fe Fumarate-FA (PRENATAL MULTIVITAMIN) TABS tablet Take 1 tablet by mouth daily at 12 noon.    [provider]    Allergies Patient has no known allergies.  History reviewed. No pertinent family history.  Social History Social History  Substance Use Topics  . Smoking status: Never Smoker  . Smokeless tobacco: Never Used  . Alcohol use No    Review of Systems  Constitutional: No fever/chills. Eyes: No visual changes. ENT: No sore  throat. Cardiovascular: Denies chest pain. Respiratory: Denies shortness of breath. Gastrointestinal: No abdominal pain.  No nausea, no vomiting.  No diarrhea.  No constipation. Genitourinary: Negative for dysuria. Musculoskeletal: Positive for right foot pain and wound. Negative for back pain. Skin: Negative for rash. Neurological: Negative for headaches, focal weakness or numbness.   ____________________________________________   PHYSICAL EXAM:  VITAL SIGNS: ED Triage Vitals  Enc Vitals Group     BP      Pulse      Resp      Temp      Temp src      SpO2      Weight      Height      Head Circumference      Peak Flow      Pain Score      Pain Loc      Pain Edu?      Excl. in GC?     Constitutional: Alert and oriented. Well appearing and in mild acute distress. Eyes: Conjunctivae are normal. PERRL. EOMI. Head: Atraumatic. Nose: No congestion/rhinnorhea. Mouth/Throat: Mucous membranes are moist.  Oropharynx non-erythematous. Neck: No stridor.   Cardiovascular: Normal rate, regular rhythm. Grossly normal heart sounds.  Good peripheral circulation. Respiratory: Normal respiratory effort.  No retractions. Lungs CTAB. Gastrointestinal: Soft and nontender. No distention. No abdominal bruits. No CVA tenderness. Musculoskeletal:  Dorsal right foot near 1st MT with approximately olive-sized mass of fleshy tissue protruding from foot which is firm to palpation. Venous oozing surrounding base of mass. 2+ distal pulses. Brisk, less than 5  second capillary refill. Neurologic:  Normal speech and language. No gross focal neurologic deficits are appreciated.  Skin:  Skin is warm, dry and intact. No rash noted. Psychiatric: Mood and affect are normal. Speech and behavior are normal.  ____________________________________________   LABS (all labs ordered are listed, but only abnormal results are displayed)  Labs Reviewed  CBC WITH DIFFERENTIAL/PLATELET - Abnormal; Notable for the  following:       Result Value   WBC 11.1 (*)    Neutro Abs 6.6 (*)    Lymphs Abs 3.7 (*)    All other components within normal limits  BASIC METABOLIC PANEL - Abnormal; Notable for the following:    Glucose, Bld 118 (*)    All other components within normal limits  CULTURE, BLOOD (ROUTINE X 2)  CULTURE, BLOOD (ROUTINE X 2)  LACTIC ACID, PLASMA   ____________________________________________  EKG  None ____________________________________________  RADIOLOGY  Dg Foot Complete Right  Result Date: 09/26/2016 CLINICAL DATA:  Open wound of the first proximal metatarsal with severe pain and bleeding. EXAM: RIGHT FOOT COMPLETE - 3+ VIEW COMPARISON:  None. FINDINGS: There is no evidence of fracture or dislocation. There is no evidence of arthropathy or other focal bone abnormality. Soft tissue irregularity along the proximal first metatarsal. IMPRESSION: Soft tissue irregularity along first proximal metatarsal. No radiographic evidence for osteomyelitis. Electronically Signed   By: Mitzi Hansen M.D.   On: 09/26/2016 04:45    ____________________________________________   PROCEDURES  Procedure(s) performed: None  Procedures  Critical Care performed: No  ____________________________________________   INITIAL IMPRESSION / ASSESSMENT AND PLAN / ED COURSE  Pertinent labs & imaging results that were available during my care of the patient were reviewed by me and considered in my medical decision making (see chart for details).  36 year old female who has had a "knot" to the top of her right foot for the past 2 months, currently on Bactrim for presumed abscess. Presents with erosion of the affected area with protruding firm mass of tissue and venous oozing. Clinically appearance does not appear to be consistent with lipoma or subcutaneous cyst. Question ganglion cyst, tumor or mass. Will administer analgesia and control bleeding.  Clinical Course as of Sep 26 825  Wynelle Link Sep 26, 2016  1610 TXA soaked gauze and compression dressing applied.  [JS]  0525 SBP in the 80s. Will add blood cultures, lactate and infuse IV fluids.  [JS]  0610 Blood pressure cuff readjusted with normalization of blood pressure. Patient was not orthostatic. She is asymptomatic without lightheadedness or dizziness. Compression dressing was unwrapped; hemostasis achieved. On standing for orthostatics area began to ooze slightly. Oozing stopped without intervention or direct pressure. Lactate pending.  [JS]  0730 Recheck wound; no further bleeding. Will discuss with podiatry for care management.  [JS]  [phone] Spoke with Dr. Alberteen Spindle who is en route to the hospital for surgery and will evaluate patient in the emergency department.  [JS]  0803 Dr. Alberteen Spindle at bedside evaluating patient. Plan to take her to the OR for excision. Will discuss with hospitalist to consult for medical clearance.  [JS]  0809 Spoke with Dr. Imogene Burn from hospitalist services who feels patient does not need medical consult as patient is young without medical issues. He will speak directly with podiatrist.  [JS]  [phone] Spoken with Dr. Alberteen Spindle who plans to excise lesion at bedside. Nursing to collect supplies and Marcaine to be ready at bedside. At this time care transferred to Dr. Lenard Lance for disposition.  [  JS]    Clinical Course User Index [JS] Irean Hong, MD     ____________________________________________   FINAL CLINICAL IMPRESSION(S) / ED DIAGNOSES  Final diagnoses:  Foot lesion      NEW MEDICATIONS STARTED DURING THIS VISIT:  New Prescriptions   No medications on file     Note:  This document was prepared using Dragon voice recognition software and may include unintentional dictation errors.    Irean Hong, MD 09/26/16 [phone]

## 2016-09-26 NOTE — Consult Note (Signed)
Reason for Consult:abnormal-appearing draining lesion on her right foot Referring Physician: Andee Poles Raeley French is an 36 y.o. female.  HPI: this is a 36 year old female with a two-month history of a sore on the top of her right foot. States it has been slowly enlarging. 4 days ago she was seen by her primary care physician who placed her on some antibiotics. Last night she had significant pain with the area and noticed some bleeding and presented to the emergency department for evaluation.  Past Medical History:  Diagnosis Date  . Medical history non-contributory     Past Surgical History:  Procedure Laterality Date  . CESAREAN SECTION      History reviewed. No pertinent family history.  Social History:  reports that she has never smoked. She has never used smokeless tobacco. She reports that she does not drink alcohol or use drugs.  Allergies: No Known Allergies  Medications:  Scheduled: . bacitracin      . bupivacaine-epinephrine  10 mL Infiltration Once    Results for orders placed or performed during the hospital encounter of 09/26/16 (from the past 48 hour(s))  CBC with Differential     Status: Abnormal   Collection Time: 09/26/16  4:30 AM  Result Value Ref Range   WBC 11.1 (H) 3.6 - 11.0 K/uL   RBC 4.44 3.80 - 5.20 MIL/uL   Hemoglobin 13.8 12.0 - 16.0 g/dL   HCT 39.7 35.0 - 47.0 %   MCV 89.4 80.0 - 100.0 fL   MCH 31.1 26.0 - 34.0 pg   MCHC 34.8 32.0 - 36.0 g/dL   RDW 13.3 11.5 - 14.5 %   Platelets 316 150 - 440 K/uL   Neutrophils Relative % 59 %   Neutro Abs 6.6 (H) 1.4 - 6.5 K/uL   Lymphocytes Relative 34 %   Lymphs Abs 3.7 (H) 1.0 - 3.6 K/uL   Monocytes Relative 5 %   Monocytes Absolute 0.6 0.2 - 0.9 K/uL   Eosinophils Relative 2 %   Eosinophils Absolute 0.2 0 - 0.7 K/uL   Basophils Relative 0 %   Basophils Absolute 0.0 0 - 0.1 K/uL  Basic metabolic panel     Status: Abnormal   Collection Time: 09/26/16  4:30 AM  Result Value Ref Range   Sodium 137 135 - 145 mmol/L   Potassium 3.5 3.5 - 5.1 mmol/L   Chloride 105 101 - 111 mmol/L   CO2 23 22 - 32 mmol/L   Glucose, Bld 118 (H) 65 - 99 mg/dL   BUN 19 6 - 20 mg/dL   Creatinine, Ser 0.89 0.44 - 1.00 mg/dL   Calcium 8.9 8.9 - 10.3 mg/dL   GFR calc non Af Amer >60 >60 mL/min   GFR calc Af Amer >60 >60 mL/min    Comment: (NOTE) The eGFR has been calculated using the CKD EPI equation. This calculation has not been validated in all clinical situations. eGFR's persistently <60 mL/min signify possible Chronic Kidney Disease.    Anion gap 9 5 - 15  Lactic acid, plasma     Status: None   Collection Time: 09/26/16  6:53 AM  Result Value Ref Range   Lactic Acid, Venous 1.3 0.5 - 1.9 mmol/L    Dg Foot Complete Right  Result Date: 09/26/2016 CLINICAL DATA:  Open wound of the first proximal metatarsal with severe pain and bleeding. EXAM: RIGHT FOOT COMPLETE - 3+ VIEW COMPARISON:  None. FINDINGS: There is no evidence of fracture or dislocation.  There is no evidence of arthropathy or other focal bone abnormality. Soft tissue irregularity along the proximal first metatarsal. IMPRESSION: Soft tissue irregularity along first proximal metatarsal. No radiographic evidence for osteomyelitis. Electronically Signed   By: Kristine Garbe M.D.   On: 09/26/2016 04:45    Review of Systems  Constitutional: Negative for chills and fever.  HENT: Negative.   Eyes: Negative.   Respiratory: Negative.   Cardiovascular: Negative.   Gastrointestinal: Negative for nausea and vomiting.  Genitourinary: Negative.   Musculoskeletal:       Pain with the lesion on her right foot  Skin:       Patient relates recent drainage and bleeding from the sore on the top of her right foot  Neurological: Negative.   Endo/Heme/Allergies: Negative.   Psychiatric/Behavioral: Negative.    Blood pressure 106/68, pulse 73, temperature 98.6 F (37 C), temperature source Oral, resp. rate 15, last menstrual period  09/11/2016, SpO2 98 %, unknown if currently breastfeeding. Physical Exam  Cardiovascular:  DP and PT pulses are fully palpable  Musculoskeletal:  Pain on any attempted palpation or manipulation of the lesion  Neurological:  Sensation appears grossly intact  Skin:  Large irregular pedunculated type lesion noted on the dorsal aspect of the right midfoot over the first metatarsal cuneiform region. Some bleeding is noted from around the base of the lesion which is partially detached. No significant cellulitis. No purulence    Assessment/Plan: Assessment:Soft tissue neoplasm right foot  Plan:Discussed with the patient through an interpreter removal of the lesion to send off for pathology. Consent was reviewed and obtained from the patient.  Procedure: The lesion on the right foot was cleansed with alcohol and injected with 0.5% bupivacaine with epinephrine 1-100,000 beneath and surrounding the lesion. The right foot was then prepped with Betadine surrounding the lesion. The lesion was then carefully excised using pickups and a pair of scissors. Upon removal there did not appear to be any deep extension through the superficial tissue and there  Was no purulence noted. Bacitracin and a sterile gauze bandage was applied followed by Kerlix and an Ace wrap for compression. Patient was instructed to keep this clean and dry until seen for follow-up in the office on Tuesday.prescription for hydrocodone for pain. Continue on her antibiotics. Note will be given for missed time at work. Dispensed a surgical shoe for the right foot. Follow-up as scheduled on Tuesday    Durward Fortes 09/26/2016, 9:49 AM

## 2016-09-29 LAB — SURGICAL PATHOLOGY

## 2016-10-01 LAB — CULTURE, BLOOD (ROUTINE X 2)
CULTURE: NO GROWTH
Culture: NO GROWTH
SPECIAL REQUESTS: ADEQUATE
Special Requests: ADEQUATE

## 2020-12-16 ENCOUNTER — Emergency Department
Admission: EM | Admit: 2020-12-16 | Discharge: 2020-12-16 | Disposition: A | Discharge status: Home / Self Care | Payer: Self-pay | Attending: Student in an Organized Health Care Education/Training Program | Admitting: Student in an Organized Health Care Education/Training Program

## 2020-12-16 ENCOUNTER — Emergency Department: Payer: Self-pay

## 2020-12-16 ENCOUNTER — Other Ambulatory Visit: Payer: Self-pay

## 2020-12-16 DIAGNOSIS — Y9389 Activity, other specified: Secondary | ICD-10-CM | POA: Diagnosis not present

## 2020-12-16 DIAGNOSIS — M79601 Pain in right arm: Secondary | ICD-10-CM | POA: Diagnosis not present

## 2020-12-16 DIAGNOSIS — Y9263 Factory as the place of occurrence of the external cause: Secondary | ICD-10-CM | POA: Insufficient documentation

## 2020-12-16 DIAGNOSIS — S63501A Unspecified sprain of right wrist, initial encounter: Secondary | ICD-10-CM | POA: Insufficient documentation

## 2020-12-16 DIAGNOSIS — X500XXA Overexertion from strenuous movement or load, initial encounter: Secondary | ICD-10-CM | POA: Diagnosis not present

## 2020-12-16 DIAGNOSIS — Y990 Civilian activity done for income or pay: Secondary | ICD-10-CM | POA: Diagnosis not present

## 2020-12-16 DIAGNOSIS — S6991XA Unspecified injury of right wrist, hand and finger(s), initial encounter: Secondary | ICD-10-CM | POA: Diagnosis present

## 2020-12-16 MED ORDER — IBUPROFEN 600 MG PO TABS: 600.0000 mg | ORAL_TABLET | Freq: Three times a day (TID) | ORAL | 0 refills | Status: AC | PRN

## 2020-12-16 MED ORDER — HYDROCODONE-ACETAMINOPHEN 5-325 MG PO TABS: 1.0000 | ORAL_TABLET | Freq: Four times a day (QID) | ORAL | 0 refills | Status: AC | PRN

## 2020-12-16 NOTE — ED Notes (Signed)
Per profile, UDS required for workers comp

## 2020-12-16 NOTE — ED Provider Notes (Signed)
[facility] Emergency Department Provider Note  ____________________________________________   Event Date/Time   First MD Initiated Contact with Patient 12/16/20 1924     (approximate)  I have reviewed the triage vital signs and the nursing notes.   HISTORY  Chief Complaint Arm Injury    HPI Melissa French is a 40 y.o. female right-hand-dominant here with right arm pain.  The patient was at work today.  She works in a [employer] and states that she was using machine that pulls boxes and moves them to the side.  She reportedly had her arm accidentally pulled in when someone else turn the machine on despite her using her hand on the machine.  It pulled her in and partially sideways.  She immediately wrapped her hand back.  She reports that since then, she has had aching, throbbing, pain along her right wrist, as well as throughout the right forearm and upper arm.  There was no crushing.  There is no open wounds.  She had a small scratch on her thumb.  No lacerations or bleeding.  Pain is worse with any kind of movement.  No alleviating factors.       Past Medical History:  Diagnosis Date   Medical history non-contributory     Patient Active Problem List   Diagnosis Date Noted   Supervision of high risk pregnancy in third trimester 01/22/2016   Advanced maternal age in multigravida 07/21/2015    Past Surgical History:  Procedure Laterality Date   CESAREAN SECTION      Prior to Admission medications   Medication Sig Start Date End Date Taking? Authorizing Provider  HYDROcodone-acetaminophen (NORCO/VICODIN) 5-325 MG tablet Take 1-2 tablets by mouth every 6 (six) hours as needed for severe pain. 12/16/20 12/16/21 Yes Shaune Pollack, MD  ibuprofen (ADVIL) 600 MG tablet Take 1 tablet (600 mg total) by mouth every 8 (eight) hours as needed for moderate pain. 12/16/20  Yes Shaune Pollack, MD  Prenatal Vit-Fe Fumarate-FA (PRENATAL MULTIVITAMIN)  TABS tablet Take 1 tablet by mouth daily at 12 noon.    [provider]    Allergies Patient has no known allergies.  No family history on file.  Social History Social History   Tobacco Use   Smoking status: Never   Smokeless tobacco: Never  Substance Use Topics   Alcohol use: No   Drug use: No    Review of Systems  Review of Systems  Constitutional:  Negative for chills and fever.  HENT:  Negative for sore throat.   Respiratory:  Negative for shortness of breath.   Cardiovascular:  Negative for chest pain.  Gastrointestinal:  Negative for abdominal pain.  Genitourinary:  Negative for flank pain.  Musculoskeletal:  Positive for arthralgias and myalgias. Negative for neck pain.  Skin:  Negative for rash and wound.  Allergic/Immunologic: Negative for immunocompromised state.  Neurological:  Negative for weakness and numbness.  Hematological:  Does not bruise/bleed easily.  All other systems reviewed and are negative.   ____________________________________________  PHYSICAL EXAM:      VITAL SIGNS: ED Triage Vitals  Enc Vitals Group     BP 12/16/20 1658 133/75     Pulse Rate 12/16/20 1658 71     Resp 12/16/20 1658 (!) 22     Temp 12/16/20 1658 98.1 F (36.7 C)     Temp Source 12/16/20 1658 Oral     SpO2 12/16/20 1658 100 %     Weight 12/16/20 1659 212  lb (96.2 kg)     Height 12/16/20 1659 5' (1.524 m)     Head Circumference --      Peak Flow --      Pain Score 12/16/20 1659 6     Pain Loc --      Pain Edu? --      Excl. in GC? --      Physical Exam Vitals and nursing note reviewed.  Constitutional:      General: She is not in acute distress.    Appearance: She is well-developed.  HENT:     Head: Normocephalic and atraumatic.  Eyes:     Conjunctiva/sclera: Conjunctivae normal.  Cardiovascular:     Rate and Rhythm: Normal rate and regular rhythm.     Heart sounds: Normal heart sounds.  Pulmonary:     Effort: Pulmonary effort is normal. No  respiratory distress.     Breath sounds: No wheezing.  Abdominal:     General: There is no distension.  Musculoskeletal:     Cervical back: Neck supple.  Skin:    General: Skin is warm.     Capillary Refill: Capillary refill takes less than 2 seconds.     Findings: No rash.  Neurological:     Mental Status: She is alert and oriented to person, place, and time.     Motor: No abnormal muscle tone.     UPPER EXTREMITY EXAM: RIGHT  INSPECTION & PALPATION: Moderate TTP over dorsal and radial wrist, hand, forearm. TTP is worse along extensors at wrist. No overt swelling. No open wounds. No bruising.  SENSORY: Sensation is intact to light touch in:  Superficial radial nerve distribution (dorsal first web space) Median nerve distribution (tip of index finger)   Ulnar nerve distribution (tip of small finger)     MOTOR:  + Motor posterior interosseous nerve (thumb IP extension) + Anterior interosseous nerve (thumb IP flexion, index finger DIP flexion) + Radial nerve (wrist extension) + Median nerve (palpable firing thenar mass) + Ulnar nerve (palpable firing of first dorsal interosseous muscle)  VASCULAR: 2+ radial pulse Brisk capillary refill < 2 sec, fingers warm and well-perfused  COMPARTMENTS: Soft, warm, well-perfused No pain with passive extension No paresthesias  ____________________________________________   LABS (all labs ordered are listed, but only abnormal results are displayed)  Labs Reviewed - No data to display  ____________________________________________  EKG:  ________________________________________  RADIOLOGY All imaging, including plain films, CT scans, and ultrasounds, independently reviewed by me, and interpretations confirmed via formal radiology reads.  ED MD interpretation:   DG Shoulder Right: No acute fx  DG Forearm: No acute fx DG Humerus: no acute fx DG Hand: negative  Official radiology report(s): DG Shoulder Right  Result Date:  12/16/2020 CLINICAL DATA:  Jerking injury to right upper extremities. EXAM: RIGHT SHOULDER - 2+ VIEW; RIGHT FOREARM - 2 VIEW; RIGHT HUMERUS - 2+ VIEW; RIGHT HAND - COMPLETE 3+ VIEW COMPARISON:  None. FINDINGS: No evidence of fracture, dislocation, or joint effusion. No evidence of severe arthropathy. No aggressive appearing focal bone abnormality. Soft tissues are unremarkable. IMPRESSION: No acute displaced fracture or dislocation of the bones of the right hand, forearm, humerus, shoulder. Electronically Signed   By: Tish Frederickson M.D.   On: 12/16/2020 18:27   DG Forearm Right  Result Date: 12/16/2020 CLINICAL DATA:  Jerking injury to right upper extremities. EXAM: RIGHT SHOULDER - 2+ VIEW; RIGHT FOREARM - 2 VIEW; RIGHT HUMERUS - 2+ VIEW; RIGHT HAND - COMPLETE 3+ VIEW  COMPARISON:  None. FINDINGS: No evidence of fracture, dislocation, or joint effusion. No evidence of severe arthropathy. No aggressive appearing focal bone abnormality. Soft tissues are unremarkable. IMPRESSION: No acute displaced fracture or dislocation of the bones of the right hand, forearm, humerus, shoulder. Electronically Signed   By: Tish Frederickson M.D.   On: 12/16/2020 18:27   DG Humerus Right  Result Date: 12/16/2020 CLINICAL DATA:  Jerking injury to right upper extremities. EXAM: RIGHT SHOULDER - 2+ VIEW; RIGHT FOREARM - 2 VIEW; RIGHT HUMERUS - 2+ VIEW; RIGHT HAND - COMPLETE 3+ VIEW COMPARISON:  None. FINDINGS: No evidence of fracture, dislocation, or joint effusion. No evidence of severe arthropathy. No aggressive appearing focal bone abnormality. Soft tissues are unremarkable. IMPRESSION: No acute displaced fracture or dislocation of the bones of the right hand, forearm, humerus, shoulder. Electronically Signed   By: Tish Frederickson M.D.   On: 12/16/2020 18:27   DG Hand Complete Right  Result Date: 12/16/2020 CLINICAL DATA:  Jerking injury to right upper extremities. EXAM: RIGHT SHOULDER - 2+ VIEW; RIGHT FOREARM - 2 VIEW;  RIGHT HUMERUS - 2+ VIEW; RIGHT HAND - COMPLETE 3+ VIEW COMPARISON:  None. FINDINGS: No evidence of fracture, dislocation, or joint effusion. No evidence of severe arthropathy. No aggressive appearing focal bone abnormality. Soft tissues are unremarkable. IMPRESSION: No acute displaced fracture or dislocation of the bones of the right hand, forearm, humerus, shoulder. Electronically Signed   By: Tish Frederickson M.D.   On: 12/16/2020 18:27    ____________________________________________  PROCEDURES   Procedure(s) performed (including Critical Care):  Procedures  ____________________________________________  INITIAL IMPRESSION / MDM / ASSESSMENT AND PLAN / ED COURSE  As part of my medical decision making, I reviewed the following data within the electronic MEDICAL RECORD NUMBER Nursing notes reviewed and incorporated, Old chart reviewed, Notes from prior ED visits, and [place] Controlled Substance Database       *Honestee Revard was evaluated in Emergency Department on 12/16/2020 for the symptoms described in the history of present illness. She was evaluated in the context of the global COVID-19 pandemic, which necessitated consideration that the patient might be at risk for infection with the SARS-CoV-2 virus that causes COVID-19. Institutional protocols and algorithms that pertain to the evaluation of patients at risk for COVID-19 are in a state of rapid change based on information released by regulatory bodies including the CDC and federal and state organizations. These policies and algorithms were followed during the patient's care in the ED.  Some ED evaluations and interventions may be delayed as a result of limited staffing during the pandemic.*     Medical Decision Making:  40 yo right hand dominant female here with right wrist pain after workplace accident. Pt has moderate TTP over dorsal wrist, with pain upon extension and TTP over extensor tendons. Suspect possible sprain. No evidence  of fx and there was no apparent crush injury. She is NVI distally. No lacerations or wounds. Will place in removable splint, have her f/u as outpt. Return precautions given.   ____________________________________________  FINAL CLINICAL IMPRESSION(S) / ED DIAGNOSES  Final diagnoses:  Wrist sprain, right, initial encounter     MEDICATIONS GIVEN DURING THIS VISIT:  Medications - No data to display   ED Discharge Orders          Ordered    ibuprofen (ADVIL) 600 MG tablet  Every 8 hours PRN        12/16/20 2050    HYDROcodone-acetaminophen (NORCO/VICODIN) 5-325 MG  tablet  Every 6 hours PRN        12/16/20 2050             Note:  This document was prepared using Dragon voice recognition software and may include unintentional dictation errors.   Shaune Pollack, MD 12/16/20 [phone]

## 2020-12-16 NOTE — ED Notes (Signed)
RN and MD assessment assisted by [employer].

## 2020-12-16 NOTE — Discharge Instructions (Signed)
Use la muequera con la mayor frecuencia posible durante la prxima semana.  No trabajar ni usar la mano derecha durante 1 semana, o hasta que lo autorice un mdico de atencin Government social research officer un seguimiento con su mdico de atencin primaria o un ortopedista en 5 a 7 das para repetir el examen y Air traffic controller. Debe consultar con su compensacin de trabajadores en el trabajo para esto.   Wear the wrist brace as often as possible for the next 1 week  No working or use of the right hand for 1 week, or until cleared by a primary physician  Follow-up with your primary doctor or an orthopedist in 5-7 days for repeat exam and check-up. You should check with your workers compensation at work for this.

## 2020-12-16 NOTE — ED Triage Notes (Signed)
Interpreter # [id number]  Pt states she was at work working with machine and it grabbed/squeezed right arm Denies loss of sensation No obvious deformity noted.   Workers comp [employer] in [employer]

## 2020-12-16 NOTE — ED Provider Notes (Signed)
Emergency Medicine Provider Triage Evaluation Note  Melissa French , a 40 y.o. female  was evaluated in triage.  Pt complains of right shoulder, right forearm, right hand pain.  Patient was trying to lose something in a conveyor belt when another employee turned the line on which caused it to pull her arm/hand.  No open wounds.  Complaining of pain primarily in the right shoulder, right forearm and hand.  No neck pain.  No other injury or complaint..  Review of Systems  Positive: Right shoulder, right forearm, right hand pain Negative: Headache, neck pain, open wounds  Physical Exam  BP 133/75   Pulse 71   Temp 98.1 F (36.7 C) (Oral)   Resp (!) 22   Ht 5' (1.524 m)   Wt 96.2 kg   SpO2 100%   BMI 41.40 kg/m  Gen:   Awake, no distress   Resp:  Normal effort  MSK:   Moves extremities without difficulty.  Full range of motion to the right upper extremity.  Slight tenderness along the anterior shoulder, distal forearm and proximal right thumb.  No palpable abnormalities.  Sensation and capillary refill intact. Other:    Medical Decision Making  Medically screening exam initiated at 5:06 PM.  Appropriate orders placed.  Sunny Schlein was informed that the remainder of the evaluation will be completed by another provider, this initial triage assessment does not replace that evaluation, and the importance of remaining in the ED until their evaluation is complete.  Patient presents to the emergency department after having a conveyor belt pulled on her arm.  She is complaining of hand, forearm shoulder pain.  Likely muscular nature but I will perform x-rays.   Lanette Hampshire 12/16/20 1710    Willy Eddy, MD 12/16/20 1752

## 2022-05-13 IMAGING — CR DG FOREARM 2V*R*
2 series · 2 of 2 positions shown · non-contrast
Comparison: None.

CLINICAL DATA: Jerking injury to right upper extremities.

EXAM:
RIGHT SHOULDER - 2+ VIEW; RIGHT FOREARM - 2 VIEW; RIGHT HUMERUS - 2+
VIEW; RIGHT HAND - COMPLETE 3+ VIEW

[forearm ap]
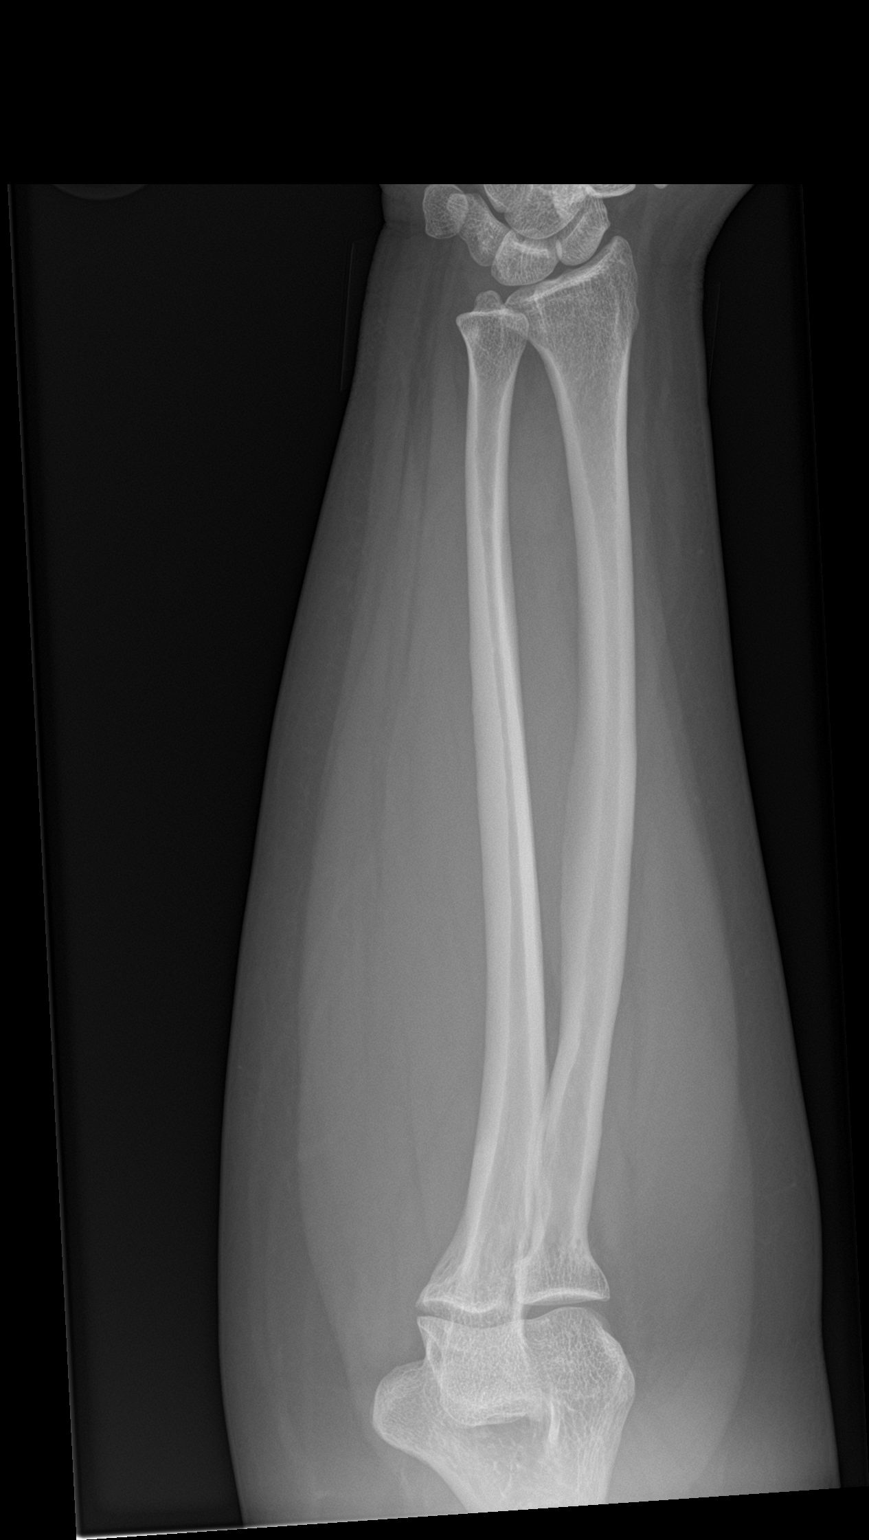

[forearm lat]
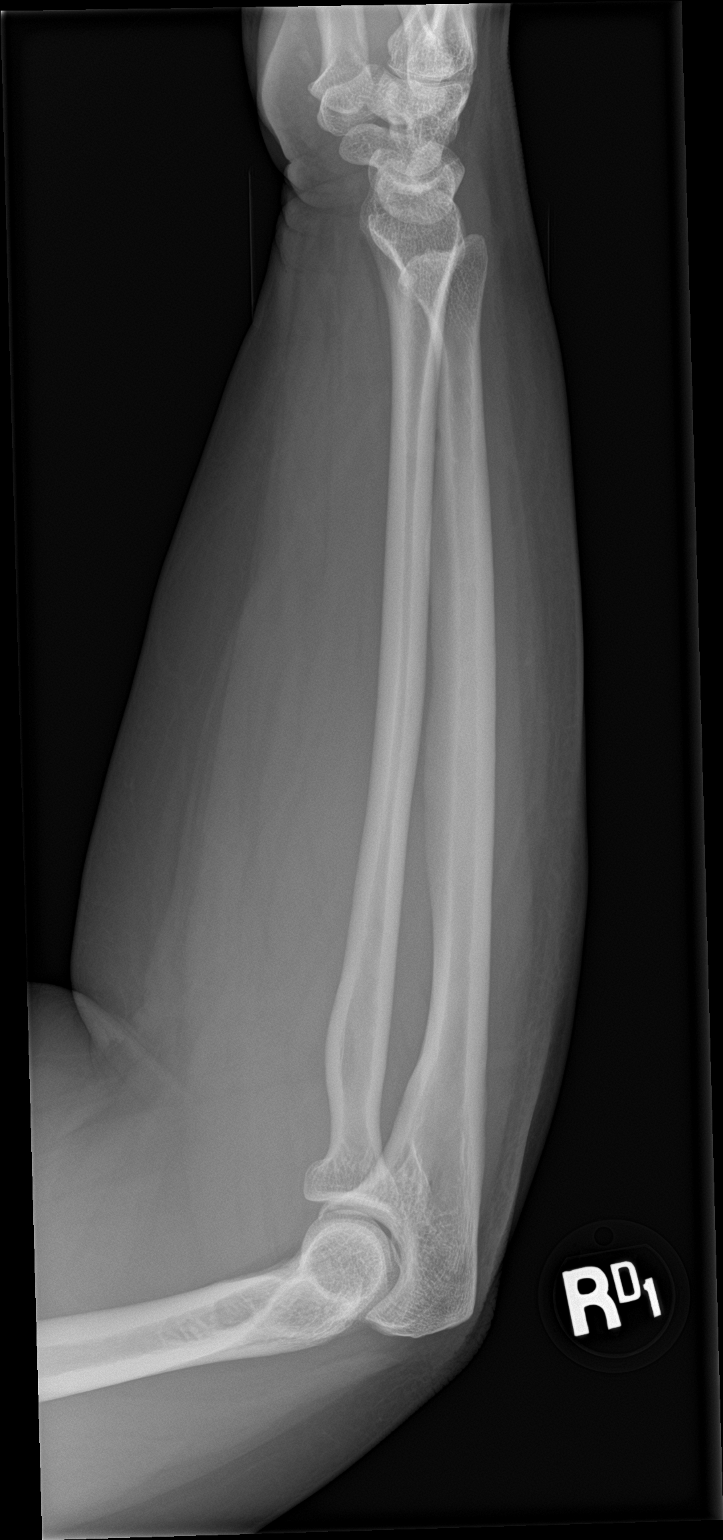

[2 of 2 positions shown; findings below may reference images not displayed]

FINDINGS: No evidence of fracture, dislocation, or joint effusion. No evidence
of severe arthropathy. No aggressive appearing focal bone
abnormality. Soft tissues are unremarkable.
IMPRESSION: No acute displaced fracture or dislocation of the bones of the right
hand, forearm, humerus, shoulder.

## 2022-05-13 IMAGING — CR DG SHOULDER 2+V*R*
3 series · 3 of 3 positions shown · non-contrast
Comparison: None.

CLINICAL DATA: Jerking injury to right upper extremities.

EXAM:
RIGHT SHOULDER - 2+ VIEW; RIGHT FOREARM - 2 VIEW; RIGHT HUMERUS - 2+
VIEW; RIGHT HAND - COMPLETE 3+ VIEW

[shoulder grashey]
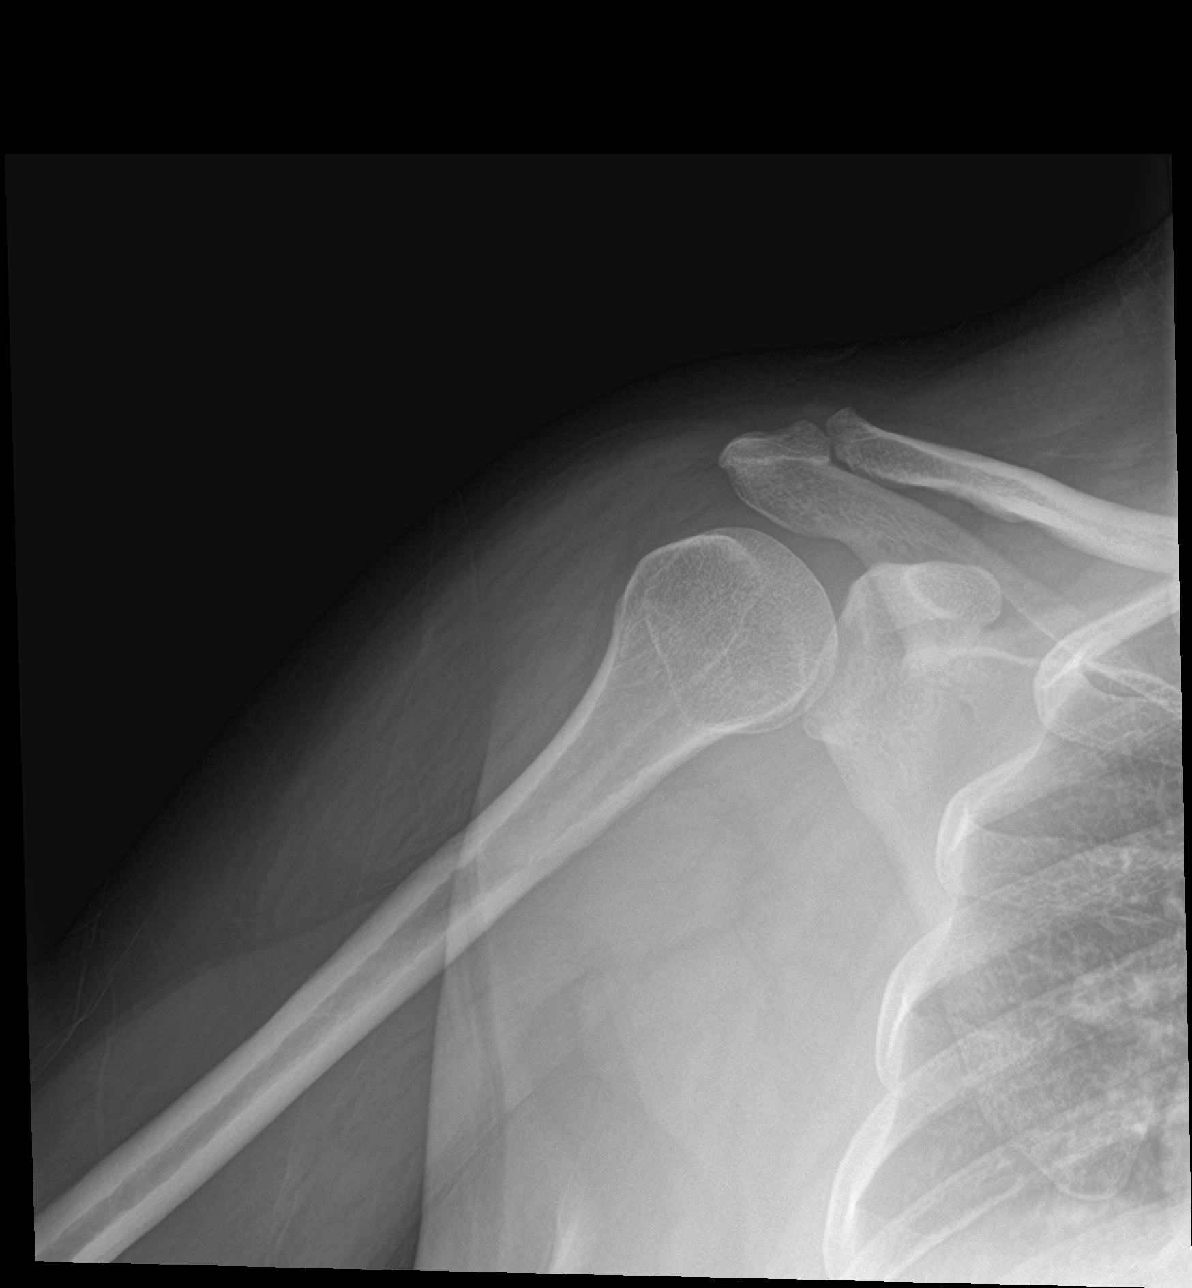

[shoulder y view]
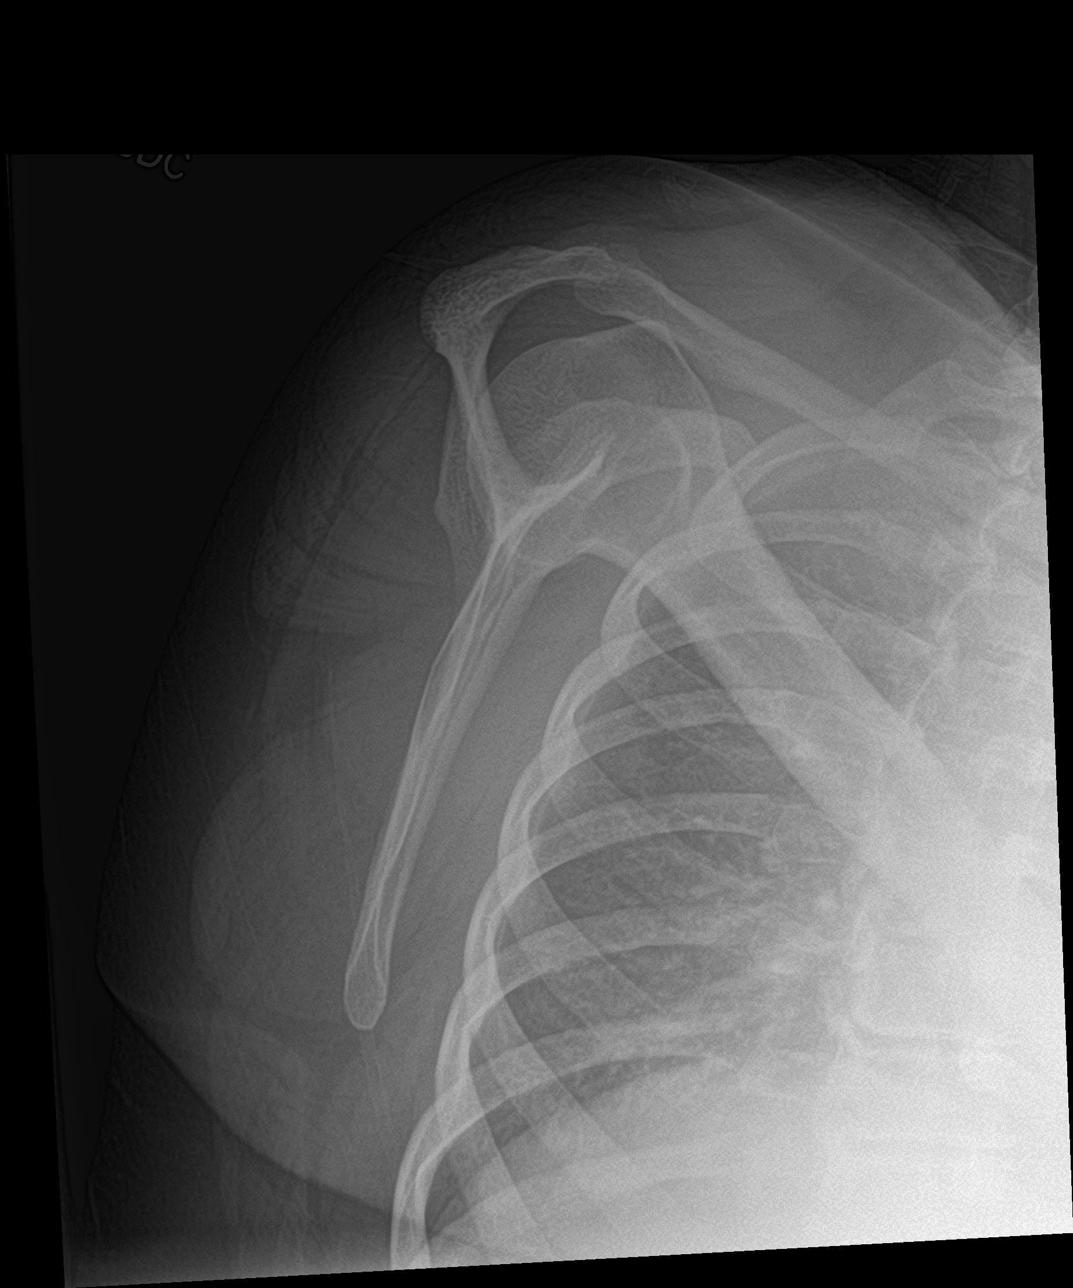

[shoulder ap neutral]
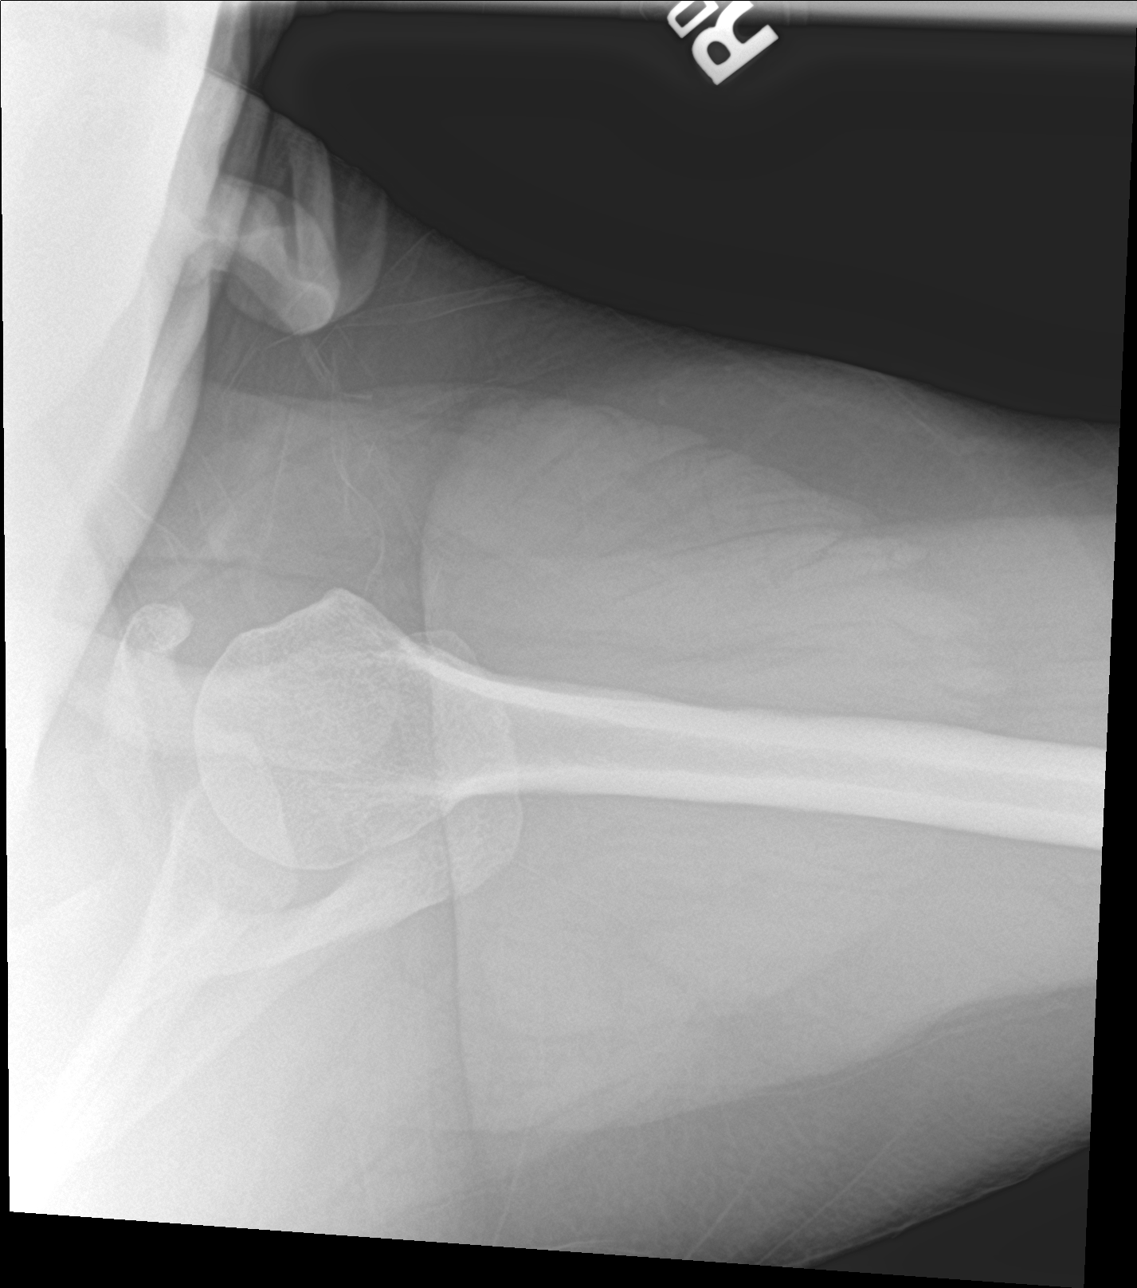

[3 of 3 positions shown; findings below may reference images not displayed]

FINDINGS: No evidence of fracture, dislocation, or joint effusion. No evidence
of severe arthropathy. No aggressive appearing focal bone
abnormality. Soft tissues are unremarkable.
IMPRESSION: No acute displaced fracture or dislocation of the bones of the right
hand, forearm, humerus, shoulder.
